# Patient Record
Sex: Female | Born: 1945 | Race: White | Hispanic: No | Marital: Married | Smoking: Never smoker
Health system: Southern US, Community
[De-identification: ages and names within clinical notes are randomized; demographics above are authoritative.]

## PROBLEM LIST (undated history)

## (undated) DIAGNOSIS — M8580 Other specified disorders of bone density and structure, unspecified site: Secondary | ICD-10-CM

## (undated) DIAGNOSIS — T7840XA Allergy, unspecified, initial encounter: Secondary | ICD-10-CM

## (undated) DIAGNOSIS — Q676 Pectus excavatum: Secondary | ICD-10-CM

## (undated) DIAGNOSIS — D1800 Hemangioma unspecified site: Secondary | ICD-10-CM

## (undated) DIAGNOSIS — E785 Hyperlipidemia, unspecified: Secondary | ICD-10-CM

## (undated) DIAGNOSIS — I493 Ventricular premature depolarization: Secondary | ICD-10-CM

## (undated) DIAGNOSIS — K219 Gastro-esophageal reflux disease without esophagitis: Secondary | ICD-10-CM

## (undated) DIAGNOSIS — I10 Essential (primary) hypertension: Secondary | ICD-10-CM

## (undated) DIAGNOSIS — I341 Nonrheumatic mitral (valve) prolapse: Secondary | ICD-10-CM

## (undated) HISTORY — PX: COLONOSCOPY: SHX174

## (undated) HISTORY — DX: Hemangioma unspecified site: D18.00

## (undated) HISTORY — DX: Pectus excavatum: Q67.6

## (undated) HISTORY — DX: Other specified disorders of bone density and structure, unspecified site: M85.80

## (undated) HISTORY — DX: Gastro-esophageal reflux disease without esophagitis: K21.9

## (undated) HISTORY — DX: Hyperlipidemia, unspecified: E78.5

## (undated) HISTORY — DX: Ventricular premature depolarization: I49.3

## (undated) HISTORY — DX: Allergy, unspecified, initial encounter: T78.40XA

---

## 1988-07-23 HISTORY — PX: BREAST EXCISIONAL BIOPSY: SUR124

## 1998-08-03 ENCOUNTER — Other Ambulatory Visit: Admission: RE | Admit: 1998-08-03 | Discharge: 1998-08-03 | Payer: Self-pay | Admitting: Obstetrics and Gynecology

## 1999-01-25 ENCOUNTER — Ambulatory Visit (HOSPITAL_COMMUNITY): Admission: RE | Admit: 1999-01-25 | Discharge: 1999-01-25 | Payer: Self-pay

## 1999-05-11 ENCOUNTER — Other Ambulatory Visit: Admission: RE | Admit: 1999-05-11 | Discharge: 1999-05-11 | Payer: Self-pay | Admitting: Obstetrics and Gynecology

## 1999-05-11 ENCOUNTER — Encounter (INDEPENDENT_AMBULATORY_CARE_PROVIDER_SITE_OTHER): Payer: Self-pay

## 1999-08-07 ENCOUNTER — Encounter: Admission: RE | Admit: 1999-08-07 | Discharge: 1999-08-07 | Payer: Self-pay | Admitting: Obstetrics and Gynecology

## 1999-08-07 ENCOUNTER — Encounter: Payer: Self-pay | Admitting: Obstetrics and Gynecology

## 1999-08-08 ENCOUNTER — Encounter (INDEPENDENT_AMBULATORY_CARE_PROVIDER_SITE_OTHER): Payer: Self-pay | Admitting: Specialist

## 1999-08-08 ENCOUNTER — Other Ambulatory Visit: Admission: RE | Admit: 1999-08-08 | Discharge: 1999-08-08 | Payer: Self-pay | Admitting: Obstetrics and Gynecology

## 2000-08-07 ENCOUNTER — Encounter: Payer: Self-pay | Admitting: Obstetrics and Gynecology

## 2000-08-07 ENCOUNTER — Encounter: Admission: RE | Admit: 2000-08-07 | Discharge: 2000-08-07 | Payer: Self-pay | Admitting: Obstetrics and Gynecology

## 2000-08-30 ENCOUNTER — Other Ambulatory Visit: Admission: RE | Admit: 2000-08-30 | Discharge: 2000-08-30 | Payer: Self-pay | Admitting: Obstetrics and Gynecology

## 2001-09-12 ENCOUNTER — Encounter: Admission: RE | Admit: 2001-09-12 | Discharge: 2001-09-12 | Payer: Self-pay | Admitting: Obstetrics and Gynecology

## 2001-09-12 ENCOUNTER — Encounter: Payer: Self-pay | Admitting: Obstetrics and Gynecology

## 2001-09-19 ENCOUNTER — Other Ambulatory Visit: Admission: RE | Admit: 2001-09-19 | Discharge: 2001-09-19 | Payer: Self-pay | Admitting: Obstetrics and Gynecology

## 2002-10-05 ENCOUNTER — Encounter: Payer: Self-pay | Admitting: Obstetrics and Gynecology

## 2002-10-05 ENCOUNTER — Encounter: Admission: RE | Admit: 2002-10-05 | Discharge: 2002-10-05 | Payer: Self-pay | Admitting: Obstetrics and Gynecology

## 2002-10-12 ENCOUNTER — Encounter: Admission: RE | Admit: 2002-10-12 | Discharge: 2002-10-12 | Payer: Self-pay | Admitting: Obstetrics and Gynecology

## 2002-10-12 ENCOUNTER — Encounter: Payer: Self-pay | Admitting: Obstetrics and Gynecology

## 2002-10-14 ENCOUNTER — Other Ambulatory Visit: Admission: RE | Admit: 2002-10-14 | Discharge: 2002-10-14 | Payer: Self-pay | Admitting: Obstetrics and Gynecology

## 2003-09-06 ENCOUNTER — Emergency Department (HOSPITAL_COMMUNITY): Admission: EM | Admit: 2003-09-06 | Discharge: 2003-09-06 | Payer: Self-pay | Admitting: Emergency Medicine

## 2003-11-23 ENCOUNTER — Encounter: Admission: RE | Admit: 2003-11-23 | Discharge: 2003-11-23 | Payer: Self-pay | Admitting: Obstetrics and Gynecology

## 2003-12-16 ENCOUNTER — Other Ambulatory Visit: Admission: RE | Admit: 2003-12-16 | Discharge: 2003-12-16 | Payer: Self-pay | Admitting: Obstetrics and Gynecology

## 2005-03-19 ENCOUNTER — Emergency Department (HOSPITAL_COMMUNITY): Admission: EM | Admit: 2005-03-19 | Discharge: 2005-03-20 | Payer: Self-pay | Admitting: Emergency Medicine

## 2005-05-16 ENCOUNTER — Encounter: Admission: RE | Admit: 2005-05-16 | Discharge: 2005-05-16 | Payer: Self-pay | Admitting: Obstetrics and Gynecology

## 2005-06-07 ENCOUNTER — Other Ambulatory Visit: Admission: RE | Admit: 2005-06-07 | Discharge: 2005-06-07 | Payer: Self-pay | Admitting: Obstetrics and Gynecology

## 2006-06-06 ENCOUNTER — Encounter: Admission: RE | Admit: 2006-06-06 | Discharge: 2006-06-06 | Payer: Self-pay | Admitting: Obstetrics and Gynecology

## 2006-06-17 ENCOUNTER — Encounter: Admission: RE | Admit: 2006-06-17 | Discharge: 2006-06-17 | Payer: Self-pay | Admitting: Obstetrics and Gynecology

## 2006-11-22 ENCOUNTER — Ambulatory Visit: Payer: Self-pay | Admitting: Internal Medicine

## 2006-12-05 ENCOUNTER — Ambulatory Visit: Payer: Self-pay | Admitting: Internal Medicine

## 2007-06-25 ENCOUNTER — Encounter: Admission: RE | Admit: 2007-06-25 | Discharge: 2007-06-25 | Payer: Self-pay | Admitting: Obstetrics and Gynecology

## 2008-06-25 ENCOUNTER — Encounter: Admission: RE | Admit: 2008-06-25 | Discharge: 2008-06-25 | Payer: Self-pay | Admitting: Obstetrics and Gynecology

## 2009-10-26 ENCOUNTER — Encounter: Admission: RE | Admit: 2009-10-26 | Discharge: 2009-10-26 | Payer: Self-pay | Admitting: Obstetrics and Gynecology

## 2009-11-03 ENCOUNTER — Encounter: Admission: RE | Admit: 2009-11-03 | Discharge: 2009-11-03 | Payer: Self-pay | Admitting: Obstetrics and Gynecology

## 2010-11-23 ENCOUNTER — Other Ambulatory Visit: Payer: Self-pay | Admitting: Obstetrics and Gynecology

## 2010-11-23 DIAGNOSIS — Z1231 Encounter for screening mammogram for malignant neoplasm of breast: Secondary | ICD-10-CM

## 2010-12-06 ENCOUNTER — Ambulatory Visit
Admission: RE | Admit: 2010-12-06 | Discharge: 2010-12-06 | Disposition: A | Discharge status: Home / Self Care | Payer: Federal, State, Local not specified - PPO | Source: Ambulatory Visit | Attending: Obstetrics and Gynecology | Admitting: Obstetrics and Gynecology

## 2010-12-06 DIAGNOSIS — Z1231 Encounter for screening mammogram for malignant neoplasm of breast: Secondary | ICD-10-CM

## 2011-10-02 ENCOUNTER — Ambulatory Visit
Admission: RE | Admit: 2011-10-02 | Discharge: 2011-10-02 | Disposition: A | Discharge status: Home / Self Care | Payer: Federal, State, Local not specified - PPO | Source: Ambulatory Visit | Attending: Allergy | Admitting: Allergy

## 2011-10-02 ENCOUNTER — Other Ambulatory Visit: Payer: Self-pay | Admitting: Allergy

## 2011-10-02 DIAGNOSIS — J329 Chronic sinusitis, unspecified: Secondary | ICD-10-CM

## 2011-11-28 ENCOUNTER — Other Ambulatory Visit: Payer: Self-pay | Admitting: Obstetrics and Gynecology

## 2011-11-28 DIAGNOSIS — N644 Mastodynia: Secondary | ICD-10-CM

## 2011-12-14 ENCOUNTER — Ambulatory Visit
Admission: RE | Admit: 2011-12-14 | Discharge: 2011-12-14 | Disposition: A | Discharge status: Home / Self Care | Payer: Federal, State, Local not specified - PPO | Source: Ambulatory Visit | Attending: Obstetrics and Gynecology | Admitting: Obstetrics and Gynecology

## 2011-12-14 DIAGNOSIS — N644 Mastodynia: Secondary | ICD-10-CM

## 2012-10-21 ENCOUNTER — Encounter (HOSPITAL_COMMUNITY): Payer: Self-pay | Admitting: *Deleted

## 2012-10-21 ENCOUNTER — Emergency Department (HOSPITAL_COMMUNITY)
Admission: EM | Admit: 2012-10-21 | Discharge: 2012-10-22 | Disposition: A | Discharge status: Home / Self Care | Payer: Federal, State, Local not specified - PPO | Attending: Emergency Medicine | Admitting: Emergency Medicine

## 2012-10-21 DIAGNOSIS — H539 Unspecified visual disturbance: Secondary | ICD-10-CM | POA: Insufficient documentation

## 2012-10-21 DIAGNOSIS — I10 Essential (primary) hypertension: Secondary | ICD-10-CM | POA: Insufficient documentation

## 2012-10-21 DIAGNOSIS — R42 Dizziness and giddiness: Secondary | ICD-10-CM | POA: Insufficient documentation

## 2012-10-21 DIAGNOSIS — R55 Syncope and collapse: Secondary | ICD-10-CM | POA: Insufficient documentation

## 2012-10-21 DIAGNOSIS — R011 Cardiac murmur, unspecified: Secondary | ICD-10-CM | POA: Insufficient documentation

## 2012-10-21 DIAGNOSIS — R112 Nausea with vomiting, unspecified: Secondary | ICD-10-CM | POA: Insufficient documentation

## 2012-10-21 DIAGNOSIS — Z8679 Personal history of other diseases of the circulatory system: Secondary | ICD-10-CM | POA: Insufficient documentation

## 2012-10-21 DIAGNOSIS — Z79899 Other long term (current) drug therapy: Secondary | ICD-10-CM | POA: Insufficient documentation

## 2012-10-21 HISTORY — DX: Nonrheumatic mitral (valve) prolapse: I34.1

## 2012-10-21 HISTORY — DX: Essential (primary) hypertension: I10

## 2012-10-21 LAB — POCT I-STAT, CHEM 8
Chloride: 109 mEq/L (ref 96–112)
Creatinine, Ser: 0.8 mg/dL (ref 0.50–1.10)
Glucose, Bld: 124 mg/dL — ABNORMAL HIGH (ref 70–99)
Hemoglobin: 15 g/dL (ref 12.0–15.0)
Potassium: 3.5 mEq/L (ref 3.5–5.1)

## 2012-10-21 LAB — CBC WITH DIFFERENTIAL/PLATELET
Basophils Absolute: 0 10*3/uL (ref 0.0–0.1)
Basophils Relative: 0 % (ref 0–1)
HCT: 43.7 % (ref 36.0–46.0)
Lymphocytes Relative: 27 % (ref 12–46)
MCHC: 33.4 g/dL (ref 30.0–36.0)
Monocytes Absolute: 0.3 10*3/uL (ref 0.1–1.0)
Neutro Abs: 3.6 10*3/uL (ref 1.7–7.7)
Neutrophils Relative %: 63 % (ref 43–77)
Platelets: 274 10*3/uL (ref 150–400)
RDW: 13.9 % (ref 11.5–15.5)
WBC: 5.6 10*3/uL (ref 4.0–10.5)

## 2012-10-21 MED ORDER — SODIUM CHLORIDE 0.9 % IV BOLUS (SEPSIS)
500.0000 mL | Freq: Once | INTRAVENOUS | Status: AC
  Administered 2012-10-21: 500 mL via INTRAVENOUS

## 2012-10-21 NOTE — ED Notes (Signed)
EMS called to [facility] event complex.  Found patient ambulatory with altered gait. Patient was able to maintain consciousness but felt she was going to pas out. EMS states that patient was clammy, pale and diaphoretic.  Patient states this  Is the third time this type of event has happened.  She has not been seen for this issue

## 2012-10-21 NOTE — ED Notes (Signed)
Bed:WA16<BR> Expected date:<BR> Expected time:<BR> Means of arrival:<BR> Comments:<BR> EMS

## 2012-10-21 NOTE — ED Provider Notes (Signed)
History     CSN: [id number]  Arrival date & time 10/21/12  2111   First MD Initiated Contact with Patient 10/21/12 2128      Chief Complaint  Patient presents with  . Near Syncope    (Consider location/radiation/quality/duration/timing/severity/associated sxs/prior treatment) HPI Comments: Patient presents from [place], [place], she was at a concert when she suddenly had a visual disturbance.  Felt nauseated.  Attempted to walk to the bathroom, could not make it to to the dizziness had to sit down.  Denies any shortness of breath, chest pain, recent illnesses.  This is his third such episode all at the [facility] during an event, the last 2, times.  She was able to make it to the restroom, vomiting.  Small amount sig for several minutes, and return.  She has not mentioned this to her primary care physician.  She does have a history of mitral valve prolapse for many years.  She was evaluated by cardiology at that time with no followup necessary.  She reports she is now back at her baseline  The history is provided by the patient.    Past Medical History  Diagnosis Date  . Hypertension   . Mitral valve prolapse     History reviewed. No pertinent past surgical history.  History reviewed. No pertinent family history.  History  Substance Use Topics  . Smoking status: Never Smoker   . Smokeless tobacco: Not on file  . Alcohol Use: Not on file     Comment: social    OB History   Grav Para Term Preterm Abortions TAB SAB Ect Mult Living                  Review of Systems  Constitutional: Negative for fever and chills.  Respiratory: Negative for shortness of breath.   Cardiovascular: Negative for chest pain and leg swelling.  Gastrointestinal: Positive for nausea.  Skin: Positive for pallor.  Neurological: Positive for dizziness and weakness.  All other systems reviewed and are negative.    Allergies  Review of patient's allergies indicates no known allergies.  Home  Medications   Current Outpatient Rx  Name  Route  Sig  Dispense  Refill  . amLODipine-benazepril (LOTREL) 5-10 MG per capsule   Oral   Take 1 capsule by mouth daily.         . Azelastine-Fluticasone (DYMISTA) 137-50 MCG/ACT SUSP   Nasal   Place 1 spray into the nose daily as needed (stuffy nose).         . Calcium-Vitamin D-Vitamin K (VIACTIV PO)   Oral   Take 1 tablet by mouth daily.         Marland Kitchen ibuprofen (ADVIL,MOTRIN) 200 MG tablet   Oral   Take 200 mg by mouth every 6 (six) hours as needed for pain.         . montelukast (SINGULAIR) 10 MG tablet   Oral   Take 10 mg by mouth at bedtime.         . Multiple Vitamin (MULTIVITAMIN WITH MINERALS) TABS   Oral   Take 1 tablet by mouth daily.           BP 135/55  Pulse 88  Temp(Src) 98.3 F (36.8 C) (Oral)  Resp 15  SpO2 96%  Physical Exam  Nursing note and vitals reviewed. Constitutional: She is oriented to person, place, and time. She appears well-developed and well-nourished.  HENT:  Head: Normocephalic.  Eyes: Pupils are equal, round, and reactive  to light.  Neck: Normal range of motion.  Cardiovascular: Normal rate, regular rhythm and intact distal pulses.  PMI is not displaced.   Murmur heard. Pulses:      Carotid pulses are 2+ on the right side, and 2+ on the left side. Pulmonary/Chest: Effort normal and breath sounds normal.  Abdominal: Soft. Bowel sounds are normal.  Musculoskeletal: Normal range of motion. She exhibits no edema and no tenderness.  Neurological: She is alert and oriented to person, place, and time.  Skin: Skin is warm and dry. She is not diaphoretic. No pallor.    ED Course  Procedures (including critical care time)  Labs Reviewed  URINALYSIS, ROUTINE W REFLEX MICROSCOPIC - Abnormal; Notable for the following:    Leukocytes, UA SMALL (*)    All other components within normal limits  POCT I-STAT, CHEM 8 - Abnormal; Notable for the following:    Glucose, Bld 124 (*)    All  other components within normal limits  CBC WITH DIFFERENTIAL  TROPONIN I  URINE MICROSCOPIC-ADD ON   No results found.   1. Near syncope     ED ECG REPORT   Date: 10/22/2012  EKG Time: 12:57 AM  Rate: 93  Rhythm: normal sinus rhythm,  L atrial abnormality  Axis: normal  Intervals:none  ST&T Change: normal  Narrative Interpretation: abnormal            MDM  Patient's labs, troponin, EKG, chest x-ray, and urine, are all reviewed.  She is no longer symptomatic.  Refer patient back to her primary care physician for followup exam in 1-2 days at which time she will discuss her syncopal or near syncopal episodes and request a cardiology followup         Arman Filter, NP 10/22/12 [phone]

## 2012-10-22 LAB — URINALYSIS, ROUTINE W REFLEX MICROSCOPIC
Nitrite: NEGATIVE
Specific Gravity, Urine: 1.013 (ref 1.005–1.030)
Urobilinogen, UA: 0.2 mg/dL (ref 0.0–1.0)
pH: 5.5 (ref 5.0–8.0)

## 2012-10-22 LAB — URINE MICROSCOPIC-ADD ON

## 2012-10-22 NOTE — ED Notes (Signed)
Patient is alert and oriented x3.  She was given DC instructions and follow up visit instructions.  Patient gave verbal understanding. She was DC ambulatory under her own power to home.  V/S stable.  He was not showing any signs of distress on DC 

## 2012-10-22 NOTE — ED Provider Notes (Signed)
Medical screening examination/treatment/procedure(s) were performed by non-physician practitioner and as supervising physician I was immediately available for consultation/collaboration.   Loren Racer, MD 10/22/12 [phone]

## 2013-02-24 ENCOUNTER — Other Ambulatory Visit: Payer: Self-pay

## 2013-02-24 DIAGNOSIS — Z1231 Encounter for screening mammogram for malignant neoplasm of breast: Secondary | ICD-10-CM

## 2013-02-25 ENCOUNTER — Other Ambulatory Visit: Payer: Self-pay

## 2013-03-12 ENCOUNTER — Ambulatory Visit
Admission: RE | Admit: 2013-03-12 | Discharge: 2013-03-12 | Disposition: A | Discharge status: Home / Self Care | Payer: Federal, State, Local not specified - PPO | Source: Ambulatory Visit

## 2013-03-12 DIAGNOSIS — Z1231 Encounter for screening mammogram for malignant neoplasm of breast: Secondary | ICD-10-CM

## 2013-05-28 ENCOUNTER — Other Ambulatory Visit: Payer: Self-pay

## 2014-05-07 ENCOUNTER — Other Ambulatory Visit: Payer: Self-pay

## 2014-10-18 ENCOUNTER — Other Ambulatory Visit: Payer: Self-pay | Admitting: *Deleted

## 2014-10-18 DIAGNOSIS — I10 Essential (primary) hypertension: Secondary | ICD-10-CM

## 2014-10-25 DIAGNOSIS — I10 Essential (primary) hypertension: Secondary | ICD-10-CM

## 2015-01-17 ENCOUNTER — Other Ambulatory Visit: Payer: Self-pay

## 2015-03-11 ENCOUNTER — Other Ambulatory Visit: Payer: Self-pay

## 2015-03-11 ENCOUNTER — Encounter: Payer: Self-pay | Admitting: Internal Medicine

## 2015-03-11 DIAGNOSIS — Z1231 Encounter for screening mammogram for malignant neoplasm of breast: Secondary | ICD-10-CM

## 2015-03-17 ENCOUNTER — Ambulatory Visit
Admission: RE | Admit: 2015-03-17 | Discharge: 2015-03-17 | Disposition: A | Discharge status: Home / Self Care | Payer: Federal, State, Local not specified - PPO | Source: Ambulatory Visit

## 2015-03-17 DIAGNOSIS — Z1231 Encounter for screening mammogram for malignant neoplasm of breast: Secondary | ICD-10-CM

## 2016-04-30 ENCOUNTER — Other Ambulatory Visit: Payer: Self-pay | Admitting: Family Medicine

## 2016-04-30 DIAGNOSIS — Z1231 Encounter for screening mammogram for malignant neoplasm of breast: Secondary | ICD-10-CM

## 2016-05-08 ENCOUNTER — Ambulatory Visit
Admission: RE | Admit: 2016-05-08 | Discharge: 2016-05-08 | Disposition: A | Discharge status: Home / Self Care | Payer: Federal, State, Local not specified - PPO | Source: Ambulatory Visit | Attending: Family Medicine | Admitting: Family Medicine

## 2016-05-08 DIAGNOSIS — Z1231 Encounter for screening mammogram for malignant neoplasm of breast: Secondary | ICD-10-CM

## 2016-07-15 ENCOUNTER — Emergency Department (HOSPITAL_COMMUNITY)
Admission: EM | Admit: 2016-07-15 | Discharge: 2016-07-15 | Disposition: A | Discharge status: Home / Self Care | Payer: Federal, State, Local not specified - PPO | Attending: Emergency Medicine | Admitting: Emergency Medicine

## 2016-07-15 ENCOUNTER — Encounter (HOSPITAL_COMMUNITY): Payer: Self-pay

## 2016-07-15 DIAGNOSIS — Y999 Unspecified external cause status: Secondary | ICD-10-CM | POA: Diagnosis not present

## 2016-07-15 DIAGNOSIS — X12XXXA Contact with other hot fluids, initial encounter: Secondary | ICD-10-CM | POA: Insufficient documentation

## 2016-07-15 DIAGNOSIS — T23292A Burn of second degree of multiple sites of left wrist and hand, initial encounter: Secondary | ICD-10-CM | POA: Insufficient documentation

## 2016-07-15 DIAGNOSIS — Y939 Activity, unspecified: Secondary | ICD-10-CM | POA: Insufficient documentation

## 2016-07-15 DIAGNOSIS — Y929 Unspecified place or not applicable: Secondary | ICD-10-CM | POA: Insufficient documentation

## 2016-07-15 DIAGNOSIS — I10 Essential (primary) hypertension: Secondary | ICD-10-CM | POA: Insufficient documentation

## 2016-07-15 DIAGNOSIS — T310 Burns involving less than 10% of body surface: Secondary | ICD-10-CM | POA: Diagnosis not present

## 2016-07-15 DIAGNOSIS — T23202A Burn of second degree of left hand, unspecified site, initial encounter: Secondary | ICD-10-CM | POA: Diagnosis present

## 2016-07-15 MED ORDER — OXYCODONE-ACETAMINOPHEN 5-325 MG PO TABS
1.0000 | ORAL_TABLET | Freq: Once | ORAL | Status: AC
  Administered 2016-07-15: 1 via ORAL
  Filled 2016-07-15: qty 1

## 2016-07-15 MED ORDER — BACITRACIN ZINC 500 UNIT/GM EX OINT: 1.0000 "application " | TOPICAL_OINTMENT | Freq: Two times a day (BID) | CUTANEOUS | 1 refills | Status: DC

## 2016-07-15 MED ORDER — TETANUS-DIPHTH-ACELL PERTUSSIS 5-2.5-18.5 LF-MCG/0.5 IM SUSP
0.5000 mL | Freq: Once | INTRAMUSCULAR | Status: AC
  Administered 2016-07-15: 0.5 mL via INTRAMUSCULAR
  Filled 2016-07-15: qty 0.5

## 2016-07-15 MED ORDER — BACITRACIN ZINC 500 UNIT/GM EX OINT
1.0000 "application " | TOPICAL_OINTMENT | Freq: Two times a day (BID) | CUTANEOUS | Status: DC
  Administered 2016-07-15: 1 via TOPICAL
  Filled 2016-07-15: qty 0.9

## 2016-07-15 MED ORDER — OXYCODONE-ACETAMINOPHEN 5-325 MG PO TABS: 1.0000 | ORAL_TABLET | Freq: Four times a day (QID) | ORAL | 0 refills | Status: DC | PRN

## 2016-07-15 NOTE — ED Notes (Addendum)
Rings removed and put in belongings.

## 2016-07-15 NOTE — ED Provider Notes (Signed)
[place] DEPT Provider Note   CSN: [id number] Arrival date & time: 07/15/16  1941     History   Chief Complaint Chief Complaint  Patient presents with  . Burn    HPI Katherine Stafford is a 70 y.o. female.  Katherine Stafford is a 70 y.o. Female who presents to the ED with a burn to her left hand. She is right hand dominant. Patient reports she was heating up some honey in the microwave when it began to spill over. She reached into the microwave when it exploded onto her hand. She complains of pain to the palm of her hand and some aspects of her fingers. Some of the honey also splashed on her face. She reports this is not causing her any pain or concern at this time. She reports her pain is to the palm of her hand. She denies any pain to the top of her hand or fingers. She placed her hand in ice water after the burn. She is unsure of her last Tdap. She denies fevers, numbness, weakness, or pain to the dorsum of her hand.    The history is provided by the patient. No language interpreter was used.  Burn     Past Medical History:  Diagnosis Date  . Hemangioma    2.7 liver lesion on chest ct  . Hypertension   . Mitral valve prolapse   . Pectus excavatum   . PVC (premature ventricular contraction)     There are no active problems to display for this patient.   History reviewed. No pertinent surgical history.  OB History    No data available       Home Medications    Prior to Admission medications   Medication Sig Start Date End Date Taking? Authorizing Provider  amLODipine-benazepril (LOTREL) 5-10 MG per capsule Take 1 capsule by mouth daily.    Historical Provider, MD  Azelastine-Fluticasone (DYMISTA) 137-50 MCG/ACT SUSP Place 1 spray into the nose daily as needed (stuffy nose).    Historical Provider, MD  bacitracin ointment Apply 1 application topically 2 (two) times daily. 07/15/16   Waynetta Pean, PA-C  Calcium-Vitamin D-Vitamin K (VIACTIV PO) Take 1 tablet by  mouth daily.    Historical Provider, MD  cetirizine (ZYRTEC) 10 MG tablet Take 10 mg by mouth daily.    Historical Provider, MD  ibuprofen (ADVIL,MOTRIN) 200 MG tablet Take 200 mg by mouth every 6 (six) hours as needed for pain.    Historical Provider, MD  levocetirizine (XYZAL) 5 MG tablet Take 5 mg by mouth every evening.    Historical Provider, MD  montelukast (SINGULAIR) 10 MG tablet Take 10 mg by mouth at bedtime.    Historical Provider, MD  Multiple Vitamin (MULTIVITAMIN WITH MINERALS) TABS Take 1 tablet by mouth daily.    Historical Provider, MD  oxyCODONE-acetaminophen (PERCOCET/ROXICET) 5-325 MG tablet Take 1 tablet by mouth every 6 (six) hours as needed for moderate pain or severe pain. 07/15/16   Waynetta Pean, PA-C    Family History Family History  Problem Relation Age of Onset  . Mitral valve prolapse Mother   . Lymphoma Father   . Rheum arthritis Daughter     Social History Social History  Substance Use Topics  . Smoking status: Never Smoker  . Smokeless tobacco: Never Used  . Alcohol use No     Comment: social     Allergies   Patient has no known allergies.   Review of Systems Review of Systems  Constitutional: Negative for fever.  Skin: Positive for color change and wound.  Neurological: Negative for weakness and numbness.     Physical Exam Updated Vital Signs BP 197/100 (BP Location: Right Arm)   Pulse 119   Temp 98.6 F (37 C) (Oral)   Resp 20   Ht 5\' 5"  (1.651 m)   Wt 68 kg   SpO2 98%   BMI 24.96 kg/m   Physical Exam  Constitutional: She appears well-developed and well-nourished. No distress.  HENT:  Head: Normocephalic and atraumatic.  Eyes: Right eye exhibits no discharge. Left eye exhibits no discharge.  Cardiovascular: Normal rate, regular rhythm and intact distal pulses.   Bilateral radial pulses are intact. Good capillary refill to her left distal fingertips.  Pulmonary/Chest: Effort normal. No respiratory distress.  Neurological:  She is alert. Coordination normal.  Skin: Skin is warm and dry. Capillary refill takes less than 2 seconds. No rash noted. She is not diaphoretic. There is erythema. No pallor.  A second degree burns noted to the palmar aspect of her left hand. Several blisters noted to the palm. Sensation is intact. Rings removed from her hand. No burns noted to the dorsal aspect of her hand. No circumferential burns.   Psychiatric: She has a normal mood and affect. Her behavior is normal.  Nursing note and vitals reviewed.    ED Treatments / Results  Labs (all labs ordered are listed, but only abnormal results are displayed) Labs Reviewed - No data to display  EKG  EKG Interpretation None       Radiology No results found.  Procedures Procedures (including critical care time)  Medications Ordered in ED Medications  bacitracin ointment 1 application (1 application Topical Given 07/15/16 2114)  oxyCODONE-acetaminophen (PERCOCET/ROXICET) 5-325 MG per tablet 1 tablet (1 tablet Oral Given 07/15/16 2105)  Tdap (BOOSTRIX) injection 0.5 mL (0.5 mLs Intramuscular Given 07/15/16 2111)     Initial Impression / Assessment and Plan / ED Course  I have reviewed the triage vital signs and the nursing notes.  Pertinent labs & imaging results that were available during my care of the patient were reviewed by me and considered in my medical decision making (see chart for details).  Clinical Course    This is a 70 y.o. Female who presents to the ED with a burn to her left hand. She is right hand dominant. Patient reports she was heating up some honey in the microwave when it began to spill over. She reached into the microwave when it exploded onto her hand. She complains of pain to the palm of her hand and some aspects of her fingers. Some of the honey also splashed on her face. She reports this is not causing her any pain or concern at this time. She reports her pain is to the palm of her hand. She denies any  pain to the top of her hand or fingers. On exam the patient is afebrile nontoxic appearing. She has second-degree burns noted to the palmar aspect of her left hand. No circumferential burns. No burns noted to the dorsal aspect of her left hand. She is neurovascularly intact. Good capillary refill. Will dress with nonadherent dressing and bacitracin. We'll have her follow-up at the [facility]. Discussed wound care instructions with dressing changes and bacitracin. I encouraged her to watch for signs of infection and discussed signs and symptoms that would warrant return to the emergency department. I advised the patient to follow-up with their primary care provider  this week. I advised the patient to return to the emergency department with new or worsening symptoms or new concerns. The patient verbalized understanding and agreement with plan.    This patient was discussed with and evaluated by Dr. Regenia Skeeter who agrees with assessment and plan.   Final Clinical Impressions(s) / ED Diagnoses   Final diagnoses:  Partial thickness burn of multiple sites of left hand, initial encounter    New Prescriptions New Prescriptions   BACITRACIN OINTMENT    Apply 1 application topically 2 (two) times daily.   OXYCODONE-ACETAMINOPHEN (PERCOCET/ROXICET) 5-325 MG TABLET    Take 1 tablet by mouth every 6 (six) hours as needed for moderate pain or severe pain.     Waynetta Pean, PA-C 07/15/16 2135    Sherwood Gambler, MD 07/16/16 [phone]

## 2016-07-15 NOTE — ED Triage Notes (Signed)
PT C/O BURN BLISTERS TO THE FACE, LEFT AND RIGHT HANDS FROM HONEY. PT STS SHE WAS HEATING HONEY IN THE MICROWAVE, WHEN IT BEGAN TO SPILL OVER. SHE WENT TO TAKE IT OUT, AND IT EXPLODED, SPLASHING HER IN THE FACE AND THE LEFT AND RIGHT HANDS, CAUSING BLISTERS.

## 2016-11-09 ENCOUNTER — Encounter (HOSPITAL_COMMUNITY): Payer: Self-pay

## 2016-11-09 ENCOUNTER — Emergency Department (HOSPITAL_COMMUNITY)
Admission: EM | Admit: 2016-11-09 | Discharge: 2016-11-10 | Disposition: A | Discharge status: Home / Self Care | Payer: Federal, State, Local not specified - PPO | Attending: Emergency Medicine | Admitting: Emergency Medicine

## 2016-11-09 DIAGNOSIS — K529 Noninfective gastroenteritis and colitis, unspecified: Secondary | ICD-10-CM | POA: Insufficient documentation

## 2016-11-09 DIAGNOSIS — R1030 Lower abdominal pain, unspecified: Secondary | ICD-10-CM | POA: Diagnosis present

## 2016-11-09 DIAGNOSIS — I10 Essential (primary) hypertension: Secondary | ICD-10-CM | POA: Insufficient documentation

## 2016-11-09 LAB — COMPREHENSIVE METABOLIC PANEL
ALT: 27 U/L (ref 14–54)
AST: 33 U/L (ref 15–41)
Albumin: 4.5 g/dL (ref 3.5–5.0)
Alkaline Phosphatase: 71 U/L (ref 38–126)
Anion gap: 11 (ref 5–15)
BUN: 15 mg/dL (ref 6–20)
CHLORIDE: 104 mmol/L (ref 101–111)
CO2: 24 mmol/L (ref 22–32)
CREATININE: 0.98 mg/dL (ref 0.44–1.00)
Calcium: 10 mg/dL (ref 8.9–10.3)
GFR calc Af Amer: >60 mL/min (ref >60)
GFR, EST NON AFRICAN AMERICAN: 57 mL/min — AB (ref >60)
Glucose, Bld: 164 mg/dL — ABNORMAL HIGH (ref 65–99)
POTASSIUM: 4.2 mmol/L (ref 3.5–5.1)
Sodium: 139 mmol/L (ref 135–145)
Total Bilirubin: 0.9 mg/dL (ref 0.3–1.2)
Total Protein: 7.8 g/dL (ref 6.5–8.1)

## 2016-11-09 LAB — URINALYSIS, ROUTINE W REFLEX MICROSCOPIC
Bilirubin Urine: NEGATIVE
GLUCOSE, UA: NEGATIVE mg/dL
Ketones, ur: NEGATIVE mg/dL
NITRITE: NEGATIVE
PH: 5 (ref 5.0–8.0)
Protein, ur: NEGATIVE mg/dL
Specific Gravity, Urine: 1.017 (ref 1.005–1.030)

## 2016-11-09 LAB — CBC
HCT: 44.1 % (ref 36.0–46.0)
Hemoglobin: 14.8 g/dL (ref 12.0–15.0)
MCH: 31 pg (ref 26.0–34.0)
MCHC: 33.6 g/dL (ref 30.0–36.0)
MCV: 92.5 fL (ref 78.0–100.0)
Platelets: 323 10*3/uL (ref 150–400)
RBC: 4.77 MIL/uL (ref 3.87–5.11)
RDW: 13.5 % (ref 11.5–15.5)
WBC: 13.7 10*3/uL — AB (ref 4.0–10.5)

## 2016-11-09 LAB — LIPASE, BLOOD: LIPASE: 22 U/L (ref 11–51)

## 2016-11-09 NOTE — ED Provider Notes (Signed)
[place] DEPT Provider Note   CSN: [id number] Arrival date & time: 11/09/16  1831 By signing my name below, I, Dyke Brackett, attest that this documentation has been prepared under the direction and in the presence of non-physician practitioner, Nehemiah Settle A Man Effertz PA-C. Electronically Signed: Dyke Brackett, Scribe. 11/09/2016. 11:48 PM.   History   Chief Complaint Chief Complaint  Patient presents with  . Abdominal Pain    HPI Katherine Stafford is a 71 y.o. female with a history of HTN who presents to the Emergency Department complaining of gradually improving, waxing and waning, suprapubic abdominal pain onset today. She describes this as cramping, severe abdominal pain that lasts for about 1 minute at a time. She notes associated diarrhea and bloody discharge, nausea, vomiting 1x, and chills. No alleviating or modifying factors noted. Per pt, she was in her normal health yesterday. She had a normal colonoscopy 10 years ago.  Pt denies any fever or difficulty urinating and has no other complaints at this time.   The history is provided by the patient. No language interpreter was used.   Past Medical History:  Diagnosis Date  . Hemangioma    2.7 liver lesion on chest ct  . Hypertension   . Mitral valve prolapse   . Pectus excavatum   . PVC (premature ventricular contraction)     There are no active problems to display for this patient.   History reviewed. No pertinent surgical history.  OB History    No data available       Home Medications    Prior to Admission medications   Medication Sig Start Date End Date Taking? Authorizing Provider  amLODipine-benazepril (LOTREL) 5-20 MG capsule Take 1 capsule by mouth 2 (two) times daily. 10/05/16  Yes Historical Provider, MD  Calcium-Vitamin D-Vitamin K (VIACTIV PO) Take 1 tablet by mouth daily.   Yes Historical Provider, MD  diphenhydramine-acetaminophen (TYLENOL PM) 25-500 MG TABS tablet Take 1-2 tablets by mouth at bedtime as  needed (pain,sleep).   Yes Historical Provider, MD  levocetirizine (XYZAL) 5 MG tablet Take 5 mg by mouth every evening.   Yes Historical Provider, MD  montelukast (SINGULAIR) 10 MG tablet Take 10 mg by mouth every morning.    Yes Historical Provider, MD  Multiple Vitamin (MULTIVITAMIN WITH MINERALS) TABS Take 1 tablet by mouth daily.   Yes Historical Provider, MD  bacitracin ointment Apply 1 application topically 2 (two) times daily. Patient not taking: Reported on 11/09/2016 07/15/16   Waynetta Pean, PA-C  oxyCODONE-acetaminophen (PERCOCET/ROXICET) 5-325 MG tablet Take 1 tablet by mouth every 6 (six) hours as needed for moderate pain or severe pain. Patient not taking: Reported on 11/09/2016 07/15/16   Waynetta Pean, PA-C    Family History Family History  Problem Relation Age of Onset  . Mitral valve prolapse Mother   . Lymphoma Father   . Rheum arthritis Daughter     Social History Social History  Substance Use Topics  . Smoking status: Never Smoker  . Smokeless tobacco: Never Used  . Alcohol use No     Comment: social     Allergies   Patient has no known allergies.   Review of Systems Review of Systems  Constitutional: Positive for chills.  Gastrointestinal: Positive for blood in stool and diarrhea.  Genitourinary: Negative for difficulty urinating.  Musculoskeletal: Negative for myalgias.  Allergic/Immunologic: Negative for immunocompromised state.  Neurological: Negative for weakness.  Psychiatric/Behavioral: Negative for confusion.     Physical Exam Updated Vital Signs BP Marland Kitchen)  153/74 (BP Location: Left Arm)   Pulse 86   Temp 99.1 F (37.3 C) (Oral)   Resp 16   Ht 5\' 5"  (1.651 m)   Wt 144 lb (65.3 kg)   SpO2 97%   BMI 23.96 kg/m   Physical Exam  Constitutional: She is oriented to person, place, and time. She appears well-developed and well-nourished. No distress.  HENT:  Head: Normocephalic and atraumatic.  Eyes: Conjunctivae are normal.    Cardiovascular: Normal rate.   Pulmonary/Chest: Effort normal.  Abdominal: Soft. She exhibits no distension. There is tenderness.  Infraumbilical and LLQ abdominal tenderness No distention.   Neurological: She is alert and oriented to person, place, and time.  Skin: Skin is warm and dry.  Psychiatric: She has a normal mood and affect.  Nursing note and vitals reviewed.  ED Treatments / Results  DIAGNOSTIC STUDIES:  Oxygen Saturation is 98% on RA, normal by my interpretation.    COORDINATION OF CARE:  11:57 PM Will order CT abdomen. Discussed treatment plan with pt at bedside and pt agreed to plan.   Labs (all labs ordered are listed, but only abnormal results are displayed) Labs Reviewed  COMPREHENSIVE METABOLIC PANEL - Abnormal; Notable for the following:       Result Value   Glucose, Bld 164 (*)    GFR calc non Af Amer 57 (*)    All other components within normal limits  CBC - Abnormal; Notable for the following:    WBC 13.7 (*)    All other components within normal limits  URINALYSIS, ROUTINE W REFLEX MICROSCOPIC - Abnormal; Notable for the following:    Hgb urine dipstick SMALL (*)    Leukocytes, UA TRACE (*)    Bacteria, UA RARE (*)    Squamous Epithelial / LPF 0-5 (*)    All other components within normal limits  LIPASE, BLOOD    EKG  EKG Interpretation None       Radiology No results found.  Procedures Procedures (including critical care time)  Medications Ordered in ED Medications - No data to display   Initial Impression / Assessment and Plan / ED Course  I have reviewed the triage vital signs and the nursing notes.  Pertinent labs & imaging results that were available during my care of the patient were reviewed by me and considered in my medical decision making (see chart for details).   Patient with stable VS presents with lower abdominal pain, diarrhea with bloody stool, low grade fever. She appears in NAD.   Labs with mild leukocytosis. CT  scan clinically corollate with dx of diverticulitis. She is felt stable for treatment as outpatient and discharged home with Cipro and flagyl. Strict return precautions discussed. Dr. Randal Buba has seen the patient and agrees with plan of care.   Final Clinical Impressions(s) / ED Diagnoses   Final diagnoses:  None   1. Diverticulitis  New Prescriptions New Prescriptions   No medications on file   I personally performed the services described in this documentation, which was scribed in my presence. The recorded information has been reviewed and is accurate.      Charlann Lange, PA-C 11/19/16 1228    April Palumbo, MD 11/19/16 [phone]

## 2016-11-09 NOTE — ED Triage Notes (Addendum)
Pt from MD office.  Pt with abdominal pain and diarrhea since yesterday.  Diarrhea with bloody discharge.  No fever.  No change in urination.  Nausea with vomiting x 1.

## 2016-11-09 NOTE — ED Notes (Signed)
Per Dr. Darl Householder, repeat labs.

## 2016-11-10 ENCOUNTER — Emergency Department (HOSPITAL_COMMUNITY): Payer: Federal, State, Local not specified - PPO

## 2016-11-10 MED ORDER — CIPROFLOXACIN HCL 500 MG PO TABS: 500.0000 mg | ORAL_TABLET | Freq: Two times a day (BID) | ORAL | 0 refills | Status: DC

## 2016-11-10 MED ORDER — METRONIDAZOLE 500 MG PO TABS: 500.0000 mg | ORAL_TABLET | Freq: Three times a day (TID) | ORAL | 0 refills | Status: DC

## 2016-11-10 MED ORDER — HYDROCODONE-ACETAMINOPHEN 5-325 MG PO TABS: 1.0000 | ORAL_TABLET | ORAL | 0 refills | Status: DC | PRN

## 2016-11-10 MED ORDER — CIPROFLOXACIN HCL 500 MG PO TABS
500.0000 mg | ORAL_TABLET | Freq: Once | ORAL | Status: AC
  Administered 2016-11-10: 500 mg via ORAL
  Filled 2016-11-10: qty 1

## 2016-11-10 MED ORDER — ONDANSETRON 4 MG PO TBDP: 4.0000 mg | ORAL_TABLET | Freq: Three times a day (TID) | ORAL | 0 refills | Status: DC | PRN

## 2016-11-10 MED ORDER — METRONIDAZOLE 500 MG PO TABS: 500.0000 mg | ORAL_TABLET | Freq: Two times a day (BID) | ORAL | 0 refills | Status: DC

## 2016-11-10 MED ORDER — METRONIDAZOLE 500 MG PO TABS
500.0000 mg | ORAL_TABLET | Freq: Once | ORAL | Status: AC
  Administered 2016-11-10: 500 mg via ORAL
  Filled 2016-11-10: qty 1

## 2016-11-10 MED ORDER — IOPAMIDOL (ISOVUE-300) INJECTION 61%
INTRAVENOUS | Status: AC
  Administered 2016-11-10: 01:00:00
  Filled 2016-11-10: qty 100

## 2016-11-10 MED ORDER — IOPAMIDOL (ISOVUE-300) INJECTION 61%
100.0000 mL | Freq: Once | INTRAVENOUS | Status: AC | PRN
  Administered 2016-11-10: 100 mL via INTRAVENOUS

## 2016-11-10 NOTE — Discharge Instructions (Signed)
Take medications as prescribed. Follow up with your doctor for recheck in 2 days. Return to the emergency department if you have any high fever, uncontrolled pain or vomiting or for new concern.

## 2016-11-13 ENCOUNTER — Encounter: Payer: Self-pay | Admitting: Internal Medicine

## 2016-12-19 ENCOUNTER — Ambulatory Visit (INDEPENDENT_AMBULATORY_CARE_PROVIDER_SITE_OTHER): Payer: Federal, State, Local not specified - PPO | Admitting: Internal Medicine

## 2016-12-19 ENCOUNTER — Encounter: Payer: Self-pay | Admitting: Internal Medicine

## 2016-12-19 VITALS — BP 140/78 | HR 108 | Ht 64.75 in | Wt 143.5 lb

## 2016-12-19 DIAGNOSIS — K219 Gastro-esophageal reflux disease without esophagitis: Secondary | ICD-10-CM | POA: Diagnosis not present

## 2016-12-19 DIAGNOSIS — K625 Hemorrhage of anus and rectum: Secondary | ICD-10-CM

## 2016-12-19 DIAGNOSIS — K559 Vascular disorder of intestine, unspecified: Secondary | ICD-10-CM | POA: Diagnosis not present

## 2016-12-19 DIAGNOSIS — R935 Abnormal findings on diagnostic imaging of other abdominal regions, including retroperitoneum: Secondary | ICD-10-CM | POA: Diagnosis not present

## 2016-12-19 DIAGNOSIS — Z1211 Encounter for screening for malignant neoplasm of colon: Secondary | ICD-10-CM | POA: Diagnosis not present

## 2016-12-19 MED ORDER — NA SULFATE-K SULFATE-MG SULF 17.5-3.13-1.6 GM/177ML PO SOLN: 1.0000 | Freq: Once | ORAL | 0 refills | Status: AC

## 2016-12-19 NOTE — Patient Instructions (Signed)

## 2016-12-19 NOTE — Progress Notes (Signed)
HISTORY OF PRESENT ILLNESS:  Katherine Stafford is a 71 y.o. female with hypertension, hyperlipidemia, anxiety/depression, and borderline personality disorder who is referred today by her primary care provider Dr. Stephanie Stafford with chief complaints of abdominal pain with rectal bleeding requiring emergency room evaluation, the need for follow-up colonoscopy, and problems with chronic acid reflux. Outside office records, emergency room record, laboratories, CT scan, prior endoscopy reports reviewed. The patient was last seen here in May 2008 when she was sent by her gynecologist for routine screening colonoscopy. The examination was normal. Follow-up in 10 years recommended. She was seen in the emergency department 11/09/2016 for cramping abdominal pain and bloody diarrhea. She reports to me low-grade fever. Blood work revealed leukocytosis with white blood cell count of 13.7. Contrast-enhanced CT scan of the abdomen and pelvis revealed left-sided segmental colitis. She was empirically treated with ciprofloxacin and metronidazole. She follow-up with her PCP 3 days later. Was still a little tender. Follow-up white blood cell count was 12.0. Hemoglobin 13.7. Impression was colitis and GI referral for "follow-up of colitis and bright red blood per rectum" made. Patient tells me that shortly thereafter problems with pain resolved. No recurrence. No bleeding. In terms of reflux, she states she has had this for at least 10 years. No dysphagia. Substernal burning discomfort intermittently. Tells me that she saw her PCP in September for chest pain which was deemed secondary to reflux. Often when lying at night. She takes famotidine on demand. She wonders if there has been any esophageal damage or should she take something different to address symptoms. Difficult to pin her down, but she seems to have symptoms several times per month. No weight loss. GI review of systems is otherwise negative. No family history of colon cancer or  esophageal cancer.  REVIEW OF SYSTEMS:  All non-GI ROS unless otherwise stated in the history of present illness negative except for seasonal allergies  Past Medical History:  Diagnosis Date  . Anxiety   . Borderline personality disorder   . Depression   . Hemangioma    2.7 liver lesion on chest ct  . Hyperlipidemia   . Hypertension   . Mitral valve prolapse   . Pectus excavatum   . PVC (premature ventricular contraction)     Past Surgical History:  Procedure Laterality Date  . NO PAST SURGERIES      Social History Katherine Stafford  reports that she has never smoked. She has never used smokeless tobacco. She reports that she drinks alcohol. She reports that she does not use drugs.  family history includes Lymphoma in her father; Mitral valve prolapse in her mother; Rheum arthritis in her daughter; Thyroid cancer in her daughter.  No Known Allergies     PHYSICAL EXAMINATION: Vital signs: BP 140/78 (BP Location: Left Arm, Patient Position: Sitting, Cuff Size: Normal)   Pulse (!) 108   Ht 5' 4.75" (1.645 m) Comment: height measured without shoes  Wt 143 lb 8 oz (65.1 kg)   BMI 24.06 kg/m   Constitutional: generally well-appearing, no acute distress Psychiatric: Anxious, alert and oriented x3, somewhat elusive with answers but cooperative Eyes: extraocular movements intact, anicteric, conjunctiva pink Mouth: oral pharynx moist, no lesions Neck: supple without thyromegaly Lymph: no lymphadenopathy Cardiovascular: heart regular rate and rhythm, no murmur Lungs: clear to auscultation bilaterally Abdomen: soft, nontender, nondistended, no obvious ascites, no peritoneal signs, normal bowel sounds, no organomegaly Rectal: Deferred until colonoscopy Extremities: no clubbing cyanosis or lower extremity edema bilaterally Skin: no lesions  on visible extremities Neuro: No focal deficits. Cranial nerves intact. No asterixis.    ASSESSMENT:  #1. Acute colitis as manifested by  abdominal pain, rectal bleeding, fever, and leukocytosis. Clinical, laboratory and radiographic presentation most consistent with ischemic colitis. Infectious colitis possible. Resolved #2. Abnormal CT scan. Segmental colitis #3. Chronic GERD without alarm features. #4. Last screening colonoscopy May 2008. Normal   PLAN:  #1. Reflux precautions #2. Schedule upper endoscopy to rule out Barrett's esophagus or other causes for chronic dyspeptic symptoms other than GERD.The nature of the procedure, as well as the risks, benefits, and alternatives were carefully and thoroughly reviewed with the patient. Ample time for discussion and questions allowed. The patient understood, was satisfied, and agreed to proceed. #3. Schedule colonoscopy satisfied follow-up screening requirements and evaluate recent problems with rectal bleeding.The nature of the procedure, as well as the risks, benefits, and alternatives were carefully and thoroughly reviewed with the patient. Ample time for discussion and questions allowed. The patient understood, was satisfied, and agreed to proceed. #4. Additional recommendations, if any, to follow results of her endoscopic procedures  A copy of this consultation note has been sent to Dr. Stephanie Stafford

## 2017-01-02 ENCOUNTER — Other Ambulatory Visit: Payer: Self-pay | Admitting: Family Medicine

## 2017-01-02 DIAGNOSIS — R42 Dizziness and giddiness: Secondary | ICD-10-CM

## 2017-01-15 ENCOUNTER — Ambulatory Visit
Admission: RE | Admit: 2017-01-15 | Discharge: 2017-01-15 | Disposition: A | Discharge status: Home / Self Care | Payer: Federal, State, Local not specified - PPO | Source: Ambulatory Visit | Attending: Family Medicine | Admitting: Family Medicine

## 2017-01-15 DIAGNOSIS — R42 Dizziness and giddiness: Secondary | ICD-10-CM

## 2017-01-15 MED ORDER — GADOBENATE DIMEGLUMINE 529 MG/ML IV SOLN
13.0000 mL | Freq: Once | INTRAVENOUS | Status: AC | PRN
  Administered 2017-01-15: 13 mL via INTRAVENOUS

## 2017-01-17 ENCOUNTER — Encounter: Payer: Self-pay | Admitting: Internal Medicine

## 2017-02-04 ENCOUNTER — Encounter: Payer: Self-pay | Admitting: Internal Medicine

## 2017-02-04 ENCOUNTER — Ambulatory Visit (AMBULATORY_SURGERY_CENTER): Payer: Federal, State, Local not specified - PPO | Admitting: Internal Medicine

## 2017-02-04 VITALS — BP 132/92 | HR 84 | Temp 98.7°F | Resp 19 | Ht 64.75 in | Wt 143.0 lb

## 2017-02-04 DIAGNOSIS — K209 Esophagitis, unspecified without bleeding: Secondary | ICD-10-CM

## 2017-02-04 DIAGNOSIS — Z1211 Encounter for screening for malignant neoplasm of colon: Secondary | ICD-10-CM

## 2017-02-04 DIAGNOSIS — Z1212 Encounter for screening for malignant neoplasm of rectum: Secondary | ICD-10-CM | POA: Diagnosis not present

## 2017-02-04 DIAGNOSIS — K559 Vascular disorder of intestine, unspecified: Secondary | ICD-10-CM

## 2017-02-04 DIAGNOSIS — K222 Esophageal obstruction: Secondary | ICD-10-CM | POA: Diagnosis not present

## 2017-02-04 DIAGNOSIS — D124 Benign neoplasm of descending colon: Secondary | ICD-10-CM

## 2017-02-04 DIAGNOSIS — K219 Gastro-esophageal reflux disease without esophagitis: Secondary | ICD-10-CM | POA: Diagnosis not present

## 2017-02-04 MED ORDER — OMEPRAZOLE 40 MG PO CPDR: 40.0000 mg | DELAYED_RELEASE_CAPSULE | Freq: Every day | ORAL | 3 refills | Status: DC

## 2017-02-04 MED ORDER — SODIUM CHLORIDE 0.9 % IV SOLN: 500.0000 mL | INTRAVENOUS | Status: AC

## 2017-02-04 NOTE — Progress Notes (Signed)
No egg or soy allergy   1st, 2nd unsucc to rt Ac and Rt hand by J scott RN- pt tolerated well

## 2017-02-04 NOTE — Progress Notes (Signed)
Called to room to assist during endoscopic procedure.  Patient ID and intended procedure confirmed with present staff. Received instructions for my participation in the procedure from the performing physician.  

## 2017-02-04 NOTE — Op Note (Signed)
[facility] Patient Name: Katherine Stafford Procedure Date: 02/04/2017 3:24 PM MRN: [id number] Endoscopist: Docia Chuck. Henrene Pastor , MD Age: 71 Referring MD:  Date of Birth: January 19, 1946 Gender: Female Account #: [id number] Procedure:                Colonoscopy, with cold snare polypectomy x 1 Indications:              Screening for colorectal malignant neoplasm.                            Negative index exam 10 years ago Medicines:                Monitored Anesthesia Care Procedure:                Pre-Anesthesia Assessment:                           - Prior to the procedure, a History and Physical                            was performed, and patient medications and                            allergies were reviewed. The patient's tolerance of                            previous anesthesia was also reviewed. The risks                            and benefits of the procedure and the sedation                            options and risks were discussed with the patient.                            All questions were answered, and informed consent                            was obtained. Prior Anticoagulants: The patient has                            taken no previous anticoagulant or antiplatelet                            agents. ASA Grade Assessment: II - A patient with                            mild systemic disease. After reviewing the risks                            and benefits, the patient was deemed in                            satisfactory condition to undergo the procedure.  After obtaining informed consent, the colonoscope                            was passed under direct vision. Throughout the                            procedure, the patient's blood pressure, pulse, and                            oxygen saturations were monitored continuously. The                            Colonoscope was introduced through the anus and   advanced to the the cecum, identified by                            appendiceal orifice and ileocecal valve. The                            ileocecal valve, appendiceal orifice, and rectum                            were photographed. The quality of the bowel                            preparation was excellent. The colonoscopy was                            performed without difficulty. The patient tolerated                            the procedure well. The bowel preparation used was                            SUPREP. Scope In: 3:37:14 PM Scope Out: 3:51:19 PM Scope Withdrawal Time: 0 hours 11 minutes 3 seconds  Total Procedure Duration: 0 hours 14 minutes 5 seconds  Findings:                 A 5 mm polyp was found in the descending colon. The                            polyp was sessile.                           Internal hemorrhoids were found during                            retroflexion. The hemorrhoids were small.                           The exam was otherwise without abnormality on                            direct and retroflexion views. Complications:  No immediate complications. Estimated blood loss:                            None. Estimated Blood Loss:     Estimated blood loss: none. Impression:               - One 5 mm polyp in the descending colon.                           - Internal hemorrhoids.                           - The examination was otherwise normal on direct                            and retroflexion views.                           - No specimens collected. Recommendation:           - Repeat colonoscopy in 5-10 years for surveillance.                           - Patient has a contact number available for                            emergencies. The signs and symptoms of potential                            delayed complications were discussed with the                            patient. Return to normal activities tomorrow.                             Written discharge instructions were provided to the                            patient.                           - Resume previous diet.                           - Continue present medications.                           - Await pathology results. Docia Chuck. Henrene Pastor, MD 02/04/2017 3:58:36 PM This report has been signed electronically.

## 2017-02-04 NOTE — Progress Notes (Signed)
A/ox3, pleased with MAC, report to RN 

## 2017-02-04 NOTE — Patient Instructions (Signed)
Impression/Recommendations:  GERD handout given to patient. Polyp handout given to patient. Hemorrhoid handout given to patient.  Reflux precautions.  Begin Omeprazole 40 mg. Daily.  Follow-up in office appointment with Dr. Henrene Pastor in 6-8 weeks.  Repeat colonoscopy in 5-10 years for surveillance.  Resume previous diet. Continue present medications.  YOU HAD AN ENDOSCOPIC PROCEDURE TODAY AT [facility]:   Refer to the procedure report that was given to you for any specific questions about what was found during the examination.  If the procedure report does not answer your questions, please call your gastroenterologist to clarify.  If you requested that your care partner not be given the details of your procedure findings, then the procedure report has been included in a sealed envelope for you to review at your convenience later.  YOU SHOULD EXPECT: Some feelings of bloating in the abdomen. Passage of more gas than usual.  Walking can help get rid of the air that was put into your GI tract during the procedure and reduce the bloating. If you had a lower endoscopy (such as a colonoscopy or flexible sigmoidoscopy) you may notice spotting of blood in your stool or on the toilet paper. If you underwent a bowel prep for your procedure, you may not have a normal bowel movement for a few days.  Please Note:  You might notice some irritation and congestion in your nose or some drainage.  This is from the oxygen used during your procedure.  There is no need for concern and it should clear up in a day or so.  SYMPTOMS TO REPORT IMMEDIATELY:   Following lower endoscopy (colonoscopy or flexible sigmoidoscopy):  Excessive amounts of blood in the stool  Significant tenderness or worsening of abdominal pains  Swelling of the abdomen that is new, acute  Fever of 100F or higher   Following upper endoscopy (EGD)  Vomiting of blood or coffee ground material  New chest pain or pain  under the shoulder blades  Painful or persistently difficult swallowing  New shortness of breath  Fever of 100F or higher  Black, tarry-looking stools  For urgent or emergent issues, a gastroenterologist can be reached at any hour by calling [phone].   DIET:  We do recommend a small meal at first, but then you may proceed to your regular diet.  Drink plenty of fluids but you should avoid alcoholic beverages for 24 hours.  ACTIVITY:  You should plan to take it easy for the rest of today and you should NOT DRIVE or use heavy machinery until tomorrow (because of the sedation medicines used during the test).    FOLLOW UP: Our staff will call the number listed on your records the next business day following your procedure to check on you and address any questions or concerns that you may have regarding the information given to you following your procedure. If we do not reach you, we will leave a message.  However, if you are feeling well and you are not experiencing any problems, there is no need to return our call.  We will assume that you have returned to your regular daily activities without incident.  If any biopsies were taken you will be contacted by phone or by letter within the next 1-3 weeks.  Please call us at [phone] if you have not heard about the biopsies in 3 weeks.    SIGNATURES/CONFIDENTIALITY: You and/or your care partner have signed paperwork which will be entered into your electronic  medical record.  These signatures attest to the fact that that the information above on your After Visit Summary has been reviewed and is understood.  Full responsibility of the confidentiality of this discharge information lies with you and/or your care-partner.

## 2017-02-04 NOTE — Op Note (Signed)
[facility] Patient Name: Katherine Stafford Procedure Date: 02/04/2017 3:24 PM MRN: [id number] Endoscopist: Docia Chuck. Henrene Pastor , MD Age: 71 Referring MD:  Date of Birth: Jul 17, 1946 Gender: Female Account #: [id number] Procedure:                Upper GI endoscopy Indications:              Suspected esophageal reflux Medicines:                Monitored Anesthesia Care Procedure:                Pre-Anesthesia Assessment:                           - Prior to the procedure, a History and Physical                            was performed, and patient medications and                            allergies were reviewed. The patient's tolerance of                            previous anesthesia was also reviewed. The risks                            and benefits of the procedure and the sedation                            options and risks were discussed with the patient.                            All questions were answered, and informed consent                            was obtained. Prior Anticoagulants: The patient has                            taken no previous anticoagulant or antiplatelet                            agents. ASA Grade Assessment: II - A patient with                            mild systemic disease. After reviewing the risks                            and benefits, the patient was deemed in                            satisfactory condition to undergo the procedure.                           After obtaining informed consent, the endoscope was  passed under direct vision. Throughout the                            procedure, the patient's blood pressure, pulse, and                            oxygen saturations were monitored continuously. The                            Endoscope was introduced through the mouth, and                            advanced to the second part of duodenum. The upper                            GI endoscopy was accomplished  without difficulty.                            The patient tolerated the procedure well. Scope In: Scope Out: Findings:                 LA Grade A (one or more mucosal breaks less than 5                            mm, not extending between tops of 2 mucosal folds)                            esophagitis was found.                           One mild benign-appearing, intrinsic stenosis was                            found 37 cm from the incisors. This measured 1.5 cm                            (inner diameter).                           The stomach was normal, save a small sliding hiatal                            hernia.                           The examined duodenum was normal.                           The cardia and gastric fundus were normal on                            retroflexion. Complications:            No immediate complications. Estimated Blood Loss:     Estimated blood loss: none. Impression:  1. 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 Small sliding hiatal hernia. Otherwise normal EGD Recommendation:           1. Reflux precautions                           2. Prescribe omeprazole 40 mg daily; #30; 11 refills                           3. Follow-up office appointment with Dr. Henrene Pastor in                            about 6-8 weeks. Docia Chuck. Henrene Pastor, MD 02/04/2017 4:09:10 PM This report has been signed electronically.

## 2017-02-05 ENCOUNTER — Telehealth: Payer: Self-pay | Admitting: *Deleted

## 2017-02-05 NOTE — Telephone Encounter (Signed)
  Follow up Call-  Call back number 02/04/2017  Post procedure Call Back phone  # [phone]  Permission to leave phone message Yes  Some recent data might be hidden     Patient questions:  Do you have a fever, pain , or abdominal swelling? No. Pain Score  0 *  Have you tolerated food without any problems? Yes.    Have you been able to return to your normal activities? Yes.    Do you have any questions about your discharge instructions: Diet   No. Medications  No. Follow up visit  No.  Do you have questions or concerns about your Care? No.  Actions: * If pain score is 4 or above: No action needed, pain <4.

## 2017-02-11 ENCOUNTER — Encounter: Payer: Self-pay | Admitting: Internal Medicine

## 2017-03-20 ENCOUNTER — Encounter: Payer: Self-pay | Admitting: Internal Medicine

## 2017-03-20 ENCOUNTER — Ambulatory Visit (INDEPENDENT_AMBULATORY_CARE_PROVIDER_SITE_OTHER): Payer: Federal, State, Local not specified - PPO | Admitting: Internal Medicine

## 2017-03-20 VITALS — BP 128/70 | HR 80 | Ht 64.75 in | Wt 145.0 lb

## 2017-03-20 DIAGNOSIS — K219 Gastro-esophageal reflux disease without esophagitis: Secondary | ICD-10-CM

## 2017-03-20 DIAGNOSIS — K222 Esophageal obstruction: Secondary | ICD-10-CM

## 2017-03-20 DIAGNOSIS — K209 Esophagitis, unspecified without bleeding: Secondary | ICD-10-CM

## 2017-03-20 DIAGNOSIS — R140 Abdominal distension (gaseous): Secondary | ICD-10-CM

## 2017-03-20 NOTE — Patient Instructions (Signed)
Please follow up in one year 

## 2017-03-20 NOTE — Progress Notes (Signed)
HISTORY OF PRESENT ILLNESS:  Katherine Stafford is a 71 y.o. female who was evaluated in the office 12/19/2016 for probable resolved acute ischemic colitis, chronic GERD, and the need for follow-up screening colonoscopy. See that dictation. On 02/04/2017 she underwent colonoscopy and upper endoscopy. Colonoscopy was normal except for internal hemorrhoids and a 5 mm descending colon polyp which was removed and found to be a sessile serrated polyp without dysplasia. Follow-up in 5 years recommended. Upper endoscopy revealed distal esophageal stricture with associated esophagitis and a small hiatal hernia. Otherwise normal. She was advised with regards to reflux precautions and prescribed omeprazole 40 mg daily. She was told follow-up at this time. Patient tells me that she has been taking her omeprazole to or 3 times per week. Has concerns over possible indications side effects. She will have reflux breakthrough with dietary indiscretion. She does report to me previous problems with intermittent solid food dysphagia including hamburger. No recent problems with dysphagia. Her any other complaint which is new is that of increased intestinal gas described as cramping which is typically relieved with defecation or passing flatus.  REVIEW OF SYSTEMS:  All non-GI ROS negative upon comprehensive review  Past Medical History:  Diagnosis Date  . Allergy   . GERD (gastroesophageal reflux disease)   . Hemangioma    2.7 liver lesion on chest ct  . Hyperlipidemia   . Hypertension   . Mitral valve prolapse   . Osteopenia   . Pectus excavatum    pt denies this   . PVC (premature ventricular contraction)     Past Surgical History:  Procedure Laterality Date  . COLONOSCOPY     10 yr ago- normal per pt.    Social History Katherine Stafford  reports that she has never smoked. She has never used smokeless tobacco. She reports that she drinks alcohol. She reports that she does not use drugs.  family history includes  Lymphoma in her father; Mitral valve prolapse in her mother; Rheum arthritis in her daughter; Thyroid cancer in her daughter.  No Known Allergies     PHYSICAL EXAMINATION: Vital signs: BP 128/70   Pulse 80   Ht 5' 4.75" (1.645 m)   Wt 145 lb (65.8 kg)   BMI 24.32 kg/m   Constitutional: generally well-appearing, no acute distress Psychiatric: alert and oriented x3, cooperative Eyes: extraocular movements intact, anicteric, conjunctiva pink Mouth: oral pharynx moist, no lesions Neck: supple no lymphadenopathy Cardiovascular: heart regular rate and rhythm, no murmur Lungs: clear to auscultation bilaterally Abdomen: soft, nontender, nondistended, no obvious ascites, no peritoneal signs, normal bowel sounds, no organomegaly Rectal:Omitted Extremities: no clubbing, cyanosis, or lower extremity edema bilaterally Skin: no lesions on visible extremities Neuro: No focal deficits. Cranial nerves intact  ASSESSMENT:  #1. GERD, complicated by esophagitis and peptic stricture. #2. Remote history of intermittent solid food dysphagia #3. Increased intestinal gas as manifested by bloating and cramping #4. Sessile serrated polyp on recent colonoscopy #5. History of ischemic colitis. Resolved   PLAN:  #1. Reflux precautions #2. Advised to take omeprazole more regularly to avoid progressive complications of reflux disease. Discussed and addressed her concerns regarding potential side effects of long-term PPI use. A prescription with multiple refills had been electronically submitted #3. Trial of probiotic once daily to see if this helps with bloating and cramping #4. Routine surveillance colonoscopy around 2023 #5. Routine GI office follow-up in 1 year. Sooner if needed  15 minutes spent face-to-face with the patient. Greater than 50% a time  use for counseling regarding her complicated GERD and new problems with intestinal gas

## 2017-04-24 ENCOUNTER — Other Ambulatory Visit: Payer: Self-pay | Admitting: Family Medicine

## 2017-04-24 DIAGNOSIS — Z1231 Encounter for screening mammogram for malignant neoplasm of breast: Secondary | ICD-10-CM

## 2017-05-09 ENCOUNTER — Ambulatory Visit
Admission: RE | Admit: 2017-05-09 | Discharge: 2017-05-09 | Disposition: A | Discharge status: Home / Self Care | Payer: Federal, State, Local not specified - PPO | Source: Ambulatory Visit | Attending: Family Medicine | Admitting: Family Medicine

## 2017-05-09 DIAGNOSIS — Z1231 Encounter for screening mammogram for malignant neoplasm of breast: Secondary | ICD-10-CM

## 2018-04-16 IMAGING — MG 2D DIGITAL SCREENING BILATERAL MAMMOGRAM WITH CAD AND ADJUNCT TO
9 of 12 series · 9 of 28 positions shown · non-contrast
Comparison: Previous exam(s).

CLINICAL DATA: Screening.

EXAM:
2D DIGITAL SCREENING BILATERAL MAMMOGRAM WITH CAD AND ADJUNCT TOMO

[L CC synth-2D]
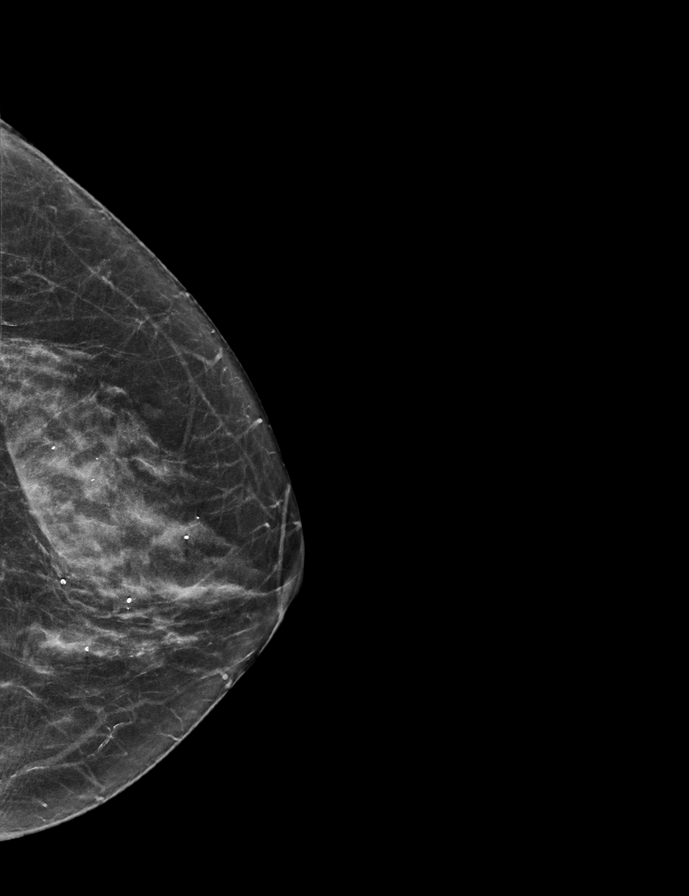

[R CC synth-2D]
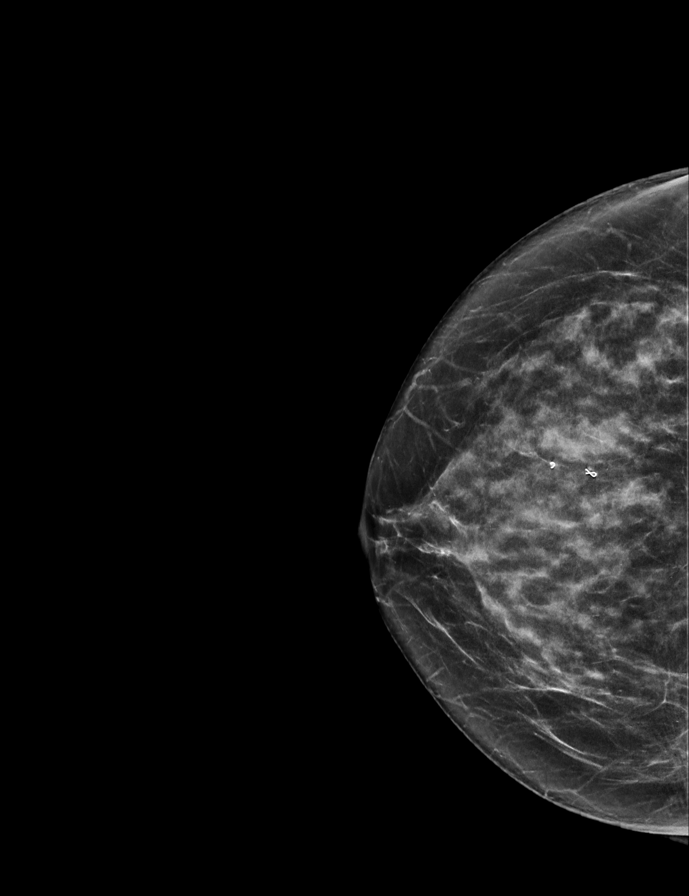

[L MLO synth-2D]
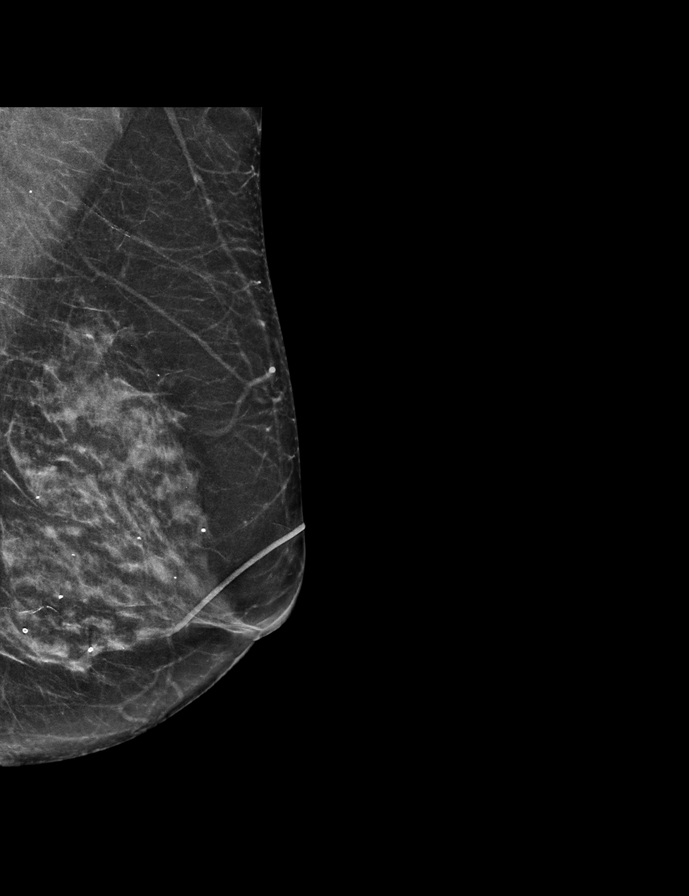

[L CC]
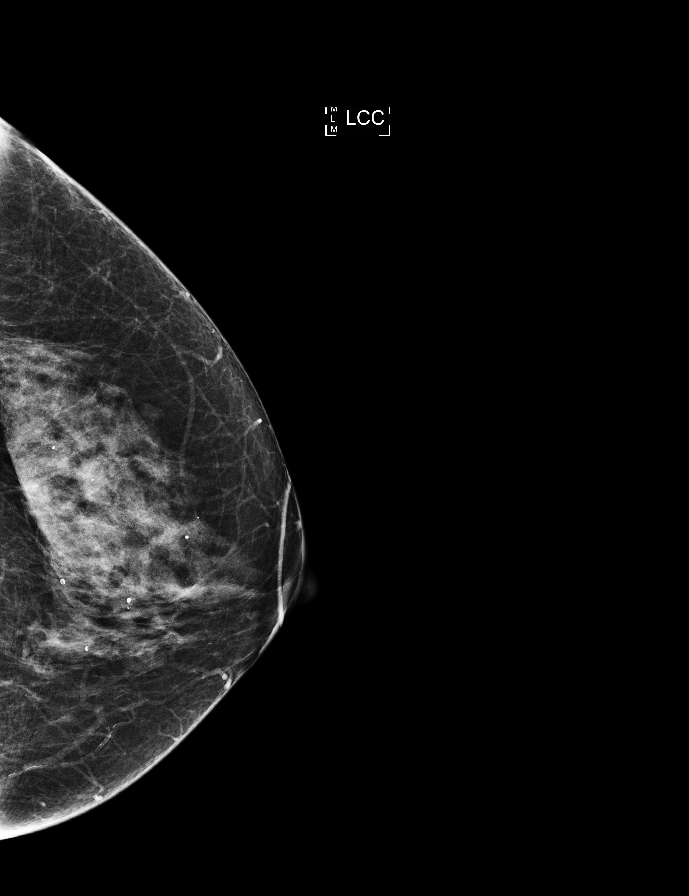

[R MLO]
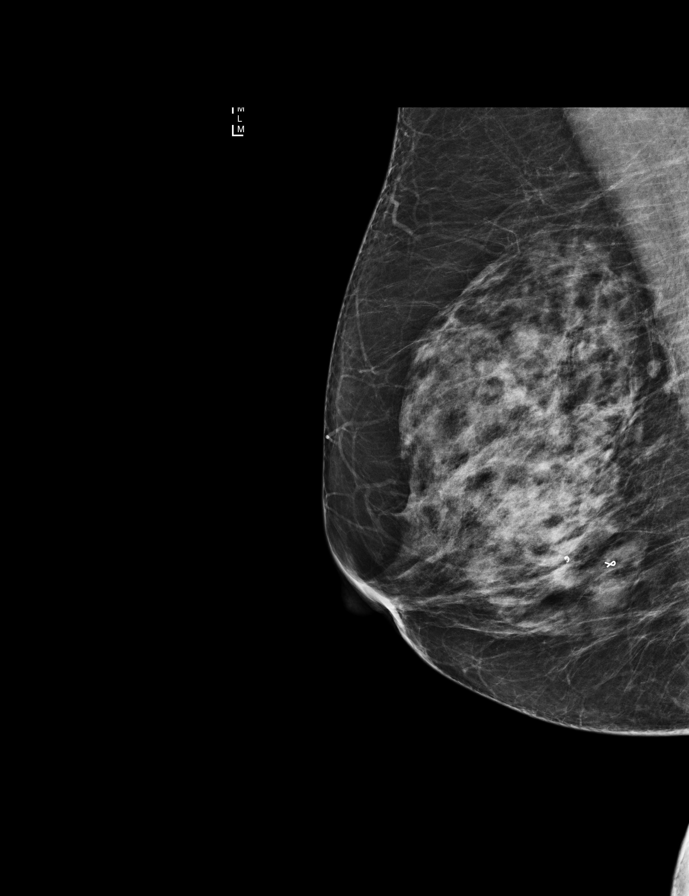

[R CC]
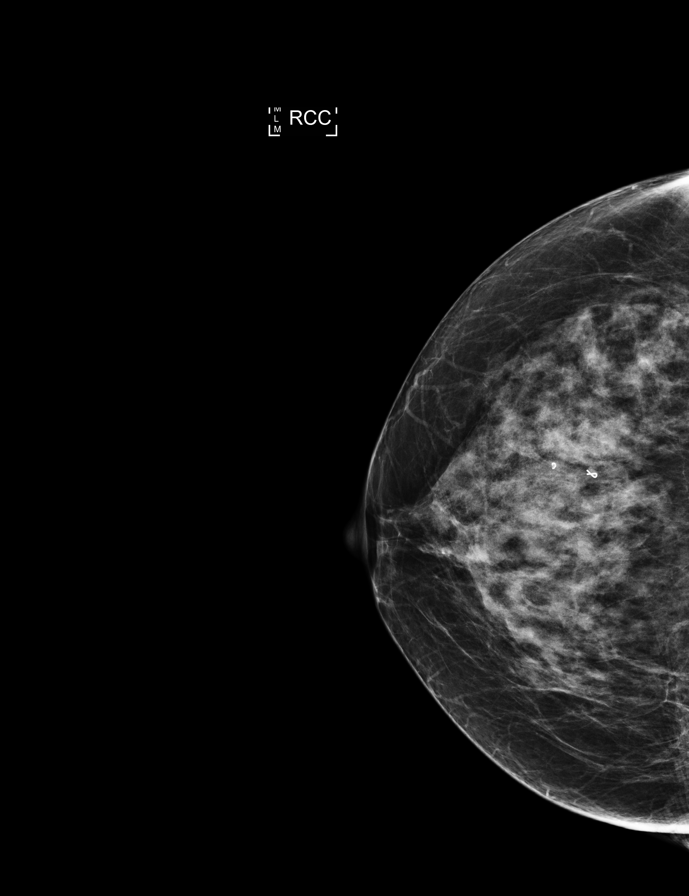

[R MLO synth-2D]
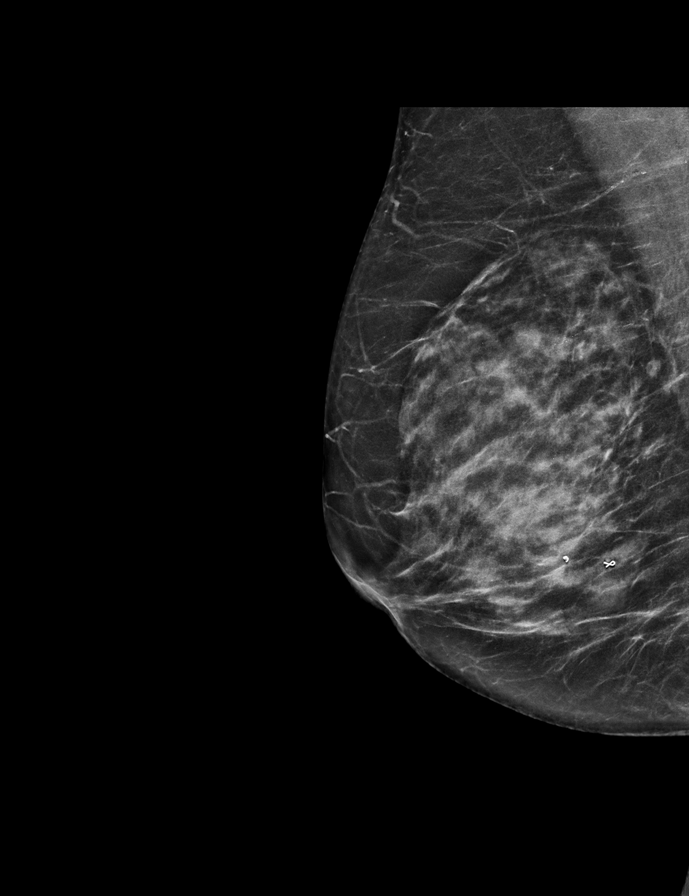

[L MLO]
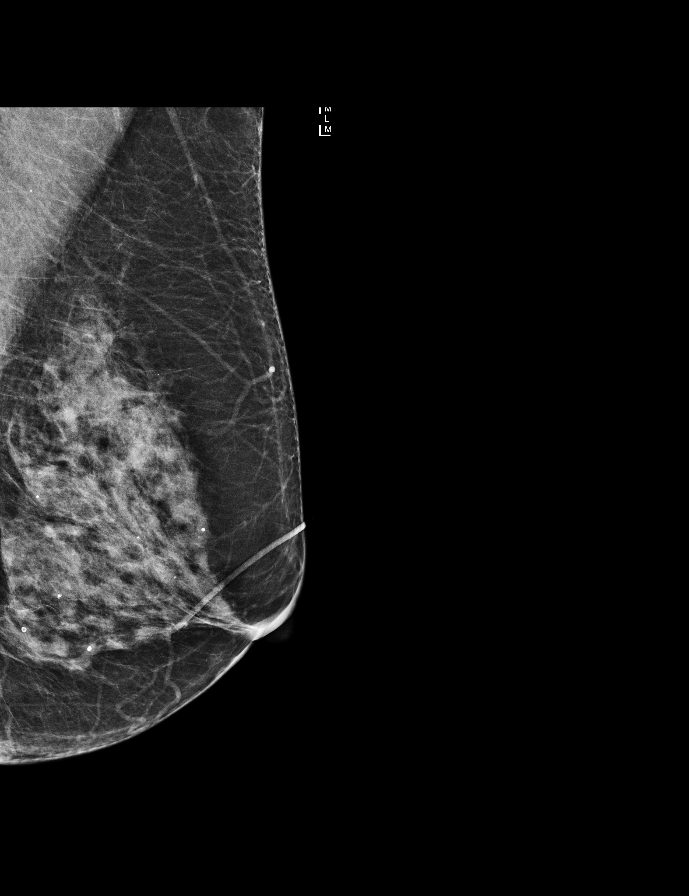

[L MLO tomo · tomo slice 28/55.0]
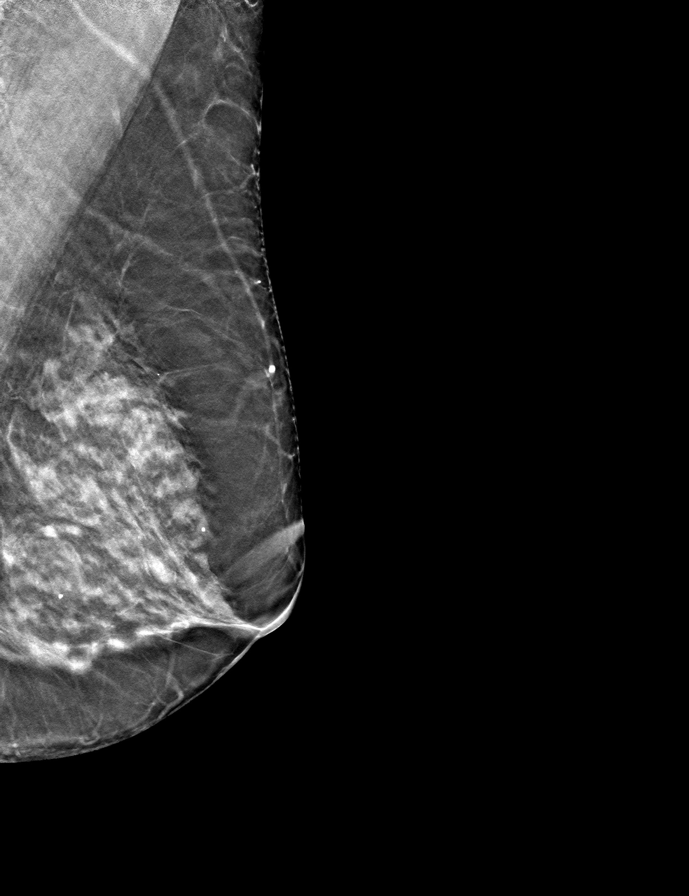

[9 of 28 positions shown; findings below may reference images not displayed]

ACR Breast Density Category c: The breast tissue is heterogeneously
dense, which may obscure small masses.
FINDINGS: There are no findings suspicious for malignancy. Images were
processed with CAD.
IMPRESSION: No mammographic evidence of malignancy. A result letter of this
screening mammogram will be mailed directly to the patient.

RECOMMENDATION:
Screening mammogram in one year. (Code:[id number]-[id number])

BI-RADS CATEGORY  1: Negative.

## 2018-05-12 ENCOUNTER — Other Ambulatory Visit: Payer: Self-pay | Admitting: Family Medicine

## 2018-05-12 DIAGNOSIS — Z1231 Encounter for screening mammogram for malignant neoplasm of breast: Secondary | ICD-10-CM

## 2018-06-16 ENCOUNTER — Ambulatory Visit
Admission: RE | Admit: 2018-06-16 | Discharge: 2018-06-16 | Disposition: A | Discharge status: Home / Self Care | Payer: Federal, State, Local not specified - PPO | Source: Ambulatory Visit | Attending: Family Medicine | Admitting: Family Medicine

## 2018-06-16 DIAGNOSIS — Z1231 Encounter for screening mammogram for malignant neoplasm of breast: Secondary | ICD-10-CM

## 2019-08-14 ENCOUNTER — Ambulatory Visit: Payer: Federal, State, Local not specified - PPO | Attending: Internal Medicine

## 2019-08-14 DIAGNOSIS — Z23 Encounter for immunization: Secondary | ICD-10-CM | POA: Insufficient documentation

## 2019-08-14 NOTE — Progress Notes (Signed)
   Covid-19 Vaccination [facility]  Name:  Katherine Stafford    MRN: [id number] DOB: [birth year]  08/14/2019  Ms. Robledo was observed post Covid-19 immunization for 15 minutes without incidence. She was provided with Vaccine Information Sheet and instruction to access the V-Safe system.   Ms. Lafavor was instructed to call 911 with any severe reactions post vaccine: Marland Kitchen Difficulty breathing  . Swelling of your face and throat  . A fast heartbeat  . A bad rash all over your body  . Dizziness and weakness    Immunizations Administered    Name Date Dose VIS Date Route   Pfizer COVID-19 Vaccine 08/14/2019 10:40 AM 0.3 mL 07/03/2019 Intramuscular   Manufacturer: [place]   Lot: [id number]   [place]: [id number]

## 2019-08-26 ENCOUNTER — Other Ambulatory Visit: Payer: Self-pay | Admitting: Family Medicine

## 2019-08-26 DIAGNOSIS — E2839 Other primary ovarian failure: Secondary | ICD-10-CM

## 2019-09-04 ENCOUNTER — Ambulatory Visit: Payer: Federal, State, Local not specified - PPO | Attending: Internal Medicine

## 2019-09-04 DIAGNOSIS — Z23 Encounter for immunization: Secondary | ICD-10-CM | POA: Insufficient documentation

## 2019-11-25 ENCOUNTER — Encounter: Payer: Self-pay | Admitting: Physician Assistant

## 2019-11-25 ENCOUNTER — Telehealth: Payer: Self-pay | Admitting: Internal Medicine

## 2019-11-25 ENCOUNTER — Ambulatory Visit: Payer: Federal, State, Local not specified - PPO | Admitting: Physician Assistant

## 2019-11-25 VITALS — BP 110/70 | HR 86 | Temp 98.5°F | Ht 64.75 in | Wt 140.0 lb

## 2019-11-25 DIAGNOSIS — K559 Vascular disorder of intestine, unspecified: Secondary | ICD-10-CM

## 2019-11-25 DIAGNOSIS — K5900 Constipation, unspecified: Secondary | ICD-10-CM | POA: Diagnosis not present

## 2019-11-25 DIAGNOSIS — Z86010 Personal history of colonic polyps: Secondary | ICD-10-CM

## 2019-11-25 DIAGNOSIS — Z8719 Personal history of other diseases of the digestive system: Secondary | ICD-10-CM | POA: Insufficient documentation

## 2019-11-25 MED ORDER — HYOSCYAMINE SULFATE 0.125 MG SL SUBL: 0.1250 mg | SUBLINGUAL_TABLET | Freq: Four times a day (QID) | SUBLINGUAL | 0 refills | Status: AC | PRN

## 2019-11-25 NOTE — Patient Instructions (Signed)
If you are age 74 or older, your body mass index should be between 23-30. Your Body mass index is 23.48 kg/m. If this is out of the aforementioned range listed, please consider follow up with your Primary Care Provider.  If you are age 85 or younger, your body mass index should be between 19-25. Your Body mass index is 23.48 kg/m. If this is out of the aformentioned range listed, please consider follow up with your Primary Care Provider.   Please start Miralax every other day 1 capful in 8 oz of water.  Increase water intake to 60 oz a day  Follow up as needed. Call if any reoccurrence of symptoms.  We have sent the following medications to your pharmacy for you to pick up at your convenience:  Levsin sublingual 1 under tongue every 6 hours as needed for abdominal cramping.   Due to recent changes in healthcare laws, you may see the results of your imaging and laboratory studies on MyChart before your provider has had a chance to review them.  We understand that in some cases there may be results that are confusing or concerning to you. Not all laboratory results come back in the same time frame and the provider may be waiting for multiple results in order to interpret others.  Please give [place] 48 hours in order for your provider to thoroughly review all the results before contacting the office for clarification of your results.

## 2019-11-25 NOTE — Telephone Encounter (Signed)
Pt with hx of ischemic colitis, has not had issues since 2018. Pt started having abd cramping and blood in stool yesterday and it has continued this am. Pt scheduled to see Nicoletta Ba PA today at 2pm. Pt aware of appt.

## 2019-11-25 NOTE — Progress Notes (Signed)
Subjective:    Patient ID: Katherine Stafford, female    DOB: 1946-02-07, 74 y.o.   MRN: [id number]  HPI Katherine Stafford is a pleasant 74 year old white female, established with Dr. Henrene Pastor who comes in today after an acute episode yesterday with severe abdominal cramping and bloody diarrhea.. Patient had an episode of segmental ischemic colitis in 2018 which was diagnosed by history and CT.  She then had colonoscopy in July 2018 with finding of 1 5 mm polyp in the left colon and internal hemorrhoids.  By path the polyp was a sessile serrated polyp.  She also had EGD at that same time with grade a esophagitis and a benign distal stricture measuring 1.5 cm in diameter and not dilated.  Also noted a small hiatal hernia.  Patient says she has been doing well over the past couple of years, is not requiring any regular medication for reflux and uses Pepcid as needed.  She had noticed some constipation recently and says she has been under a lot of stress over the past couple of months. Yesterday afternoon after eating lunch about an hour later she developed severe abdominal cramping, followed by nausea vomiting diaphoresis, diarrhea which became bloody.  She says this was similar to the episode she had in 2018.  She continued to have some diarrhea mixed with blood every couple of hours over the next several hours.  This morning she says her stool was semiformed and did not contain any blood with the last bowel movement She is feeling better, no further nausea and vomiting, no fever.  She has been keeping down some soft foods.  She is still having some domino cramping though significantly improved over yesterday. She has not been on any new medications, no hormones, no cold medications.  Says she has not been very good about her diet recently and may have been a bit dehydrated.  Review of Systems Pertinent positive and negative review of systems were noted in the above HPI section.  All other review of systems was otherwise  negative.  Outpatient Encounter Medications as of 11/25/2019  Medication Sig  . amLODipine-benazepril (LOTREL) 5-20 MG capsule Take 1 capsule by mouth 2 (two) times daily.  Marland Kitchen BYSTOLIC 5 MG tablet Take 5 mg by mouth daily.  . Calcium-Vitamin D-Vitamin K (VIACTIV PO) Take 1 tablet by mouth daily.  . Multiple Vitamin (MULTIVITAMIN WITH MINERALS) TABS Take 1 tablet by mouth daily.  . hyoscyamine (LEVSIN SL) 0.125 MG SL tablet Place 1 tablet (0.125 mg total) under the tongue every 6 (six) hours as needed.  . [DISCONTINUED] levocetirizine (XYZAL) 5 MG tablet Take 5 mg by mouth every evening.  . [DISCONTINUED] montelukast (SINGULAIR) 10 MG tablet Take 10 mg by mouth every morning.   . [DISCONTINUED] omeprazole (PRILOSEC) 40 MG capsule Take 1 capsule (40 mg total) by mouth daily.   No facility-administered encounter medications on file as of 11/25/2019.   No Known Allergies Patient Active Problem List   Diagnosis Date Noted  . History of ischemic colitis 11/25/2019  . Hx of adenomatous polyp of colon 11/25/2019   Social History   Socioeconomic History  . Marital status: Married    Spouse name: Not on file  . Number of children: 3  . Years of education: Not on file  . Highest education level: Not on file  Occupational History  . Occupation: retired  Tobacco Use  . Smoking status: Never Smoker  . Smokeless tobacco: Never Used  Substance and Sexual Activity  .  Alcohol use: Yes    Comment: social wine or cocktail  . Drug use: No  . Sexual activity: Not on file  Other Topics Concern  . Not on file  Social History Narrative  . Not on file   Social Determinants of Health   Financial Resource Strain:   . Difficulty of Paying Living Expenses:   Food Insecurity:   . Worried About Charity fundraiser in the Last Year:   . Arboriculturist in the Last Year:   Transportation Needs:   . Film/video editor (Medical):   Marland Kitchen Lack of Transportation (Non-Medical):   Physical Activity:   .  Days of Exercise per Week:   . Minutes of Exercise per Session:   Stress:   . Feeling of Stress :   Social Connections:   . Frequency of Communication with Friends and Family:   . Frequency of Social Gatherings with Friends and Family:   . Attends Religious Services:   . Active Member of Clubs or Organizations:   . Attends Archivist Meetings:   Marland Kitchen Marital Status:   Intimate Partner Violence:   . Fear of Current or Ex-Partner:   . Emotionally Abused:   Marland Kitchen Physically Abused:   . Sexually Abused:     Ms. Scaffidi's family history includes Lymphoma in her father; Mitral valve prolapse in her mother; Rheum arthritis in her daughter; Thyroid cancer in her daughter.      Objective:    Vitals:   11/25/19 1400  BP: 110/70  Pulse: 86  Temp: 98.5 F (36.9 C)    Physical Exam Well-developed well-nourished older white female in no acute distress.  , Weight 140, BMI 23.4  HEENT; nontraumatic normocephalic, EOMI, PER R LA, sclera anicteric. Oropharynx; not examined today Neck; supple, no JVD Cardiovascular; regular rate and rhythm with S1-S2, no murmur rub or gallop Pulmonary; Clear bilaterally Abdomen; soft, she is minimally tender in the left lower quadrant, no guarding or rebound, nondistended, no palpable mass or hepatosplenomegaly, bowel sounds are active Rectal; not done today Skin; benign exam, no jaundice rash or appreciable lesions Extremities; no clubbing cyanosis or edema skin warm and dry Neuro/Psych; alert and oriented x4, grossly nonfocal mood and affect appropriate       Assessment & Plan:   #29 74 year old white female with acute episode onset yesterday with severe crampy abdominal pain, nausea vomiting diaphoresis diarrhea followed by bloody diarrhea.  Symptoms significantly improved today with no further nausea vomiting or bleeding and diarrhea resolving. Symptoms are very reminiscent of a prior episode of segmental ischemic colitis in 2018, and her symptoms  are very suggestive of recurrent segmental ischemic colitis. Fortunately I think she had a mild episode and is already resolving.  #2 history of sessile serrated polyp-up-to-date with colonoscopy, due for follow-up 2023 #3 GERD-infrequent symptoms managed with famotidine #4 hypertension #5 hyperlipidemia  Plan; natural history and management of segmental ischemic colitis discussed with the patient. She will gradually advance her diet. Advise she increase her water intake on a consistent basis to at least 60 ounces per day. She will use MiraLAX 17 g in 8 ounces of water daily or every other day to manage her mild constipation. Prescription sent for Levsin sublingual to use every 6 hours as needed for abdominal cramping. She will follow-up with Dr. Henrene Pastor or myself on an as-needed basis and was advised to call should she have any recurrence of symptoms.  Amy S Esterwood PA-C 11/25/2019   Cc:  Jonathon Jordan, MD

## 2019-11-25 NOTE — Progress Notes (Signed)
Assessment and plan reviewed 

## 2020-02-17 ENCOUNTER — Other Ambulatory Visit: Payer: Self-pay | Admitting: Family Medicine

## 2020-02-17 DIAGNOSIS — Z1231 Encounter for screening mammogram for malignant neoplasm of breast: Secondary | ICD-10-CM

## 2020-03-03 ENCOUNTER — Ambulatory Visit
Admission: RE | Admit: 2020-03-03 | Discharge: 2020-03-03 | Disposition: A | Discharge status: Home / Self Care | Payer: Federal, State, Local not specified - PPO | Source: Ambulatory Visit | Attending: Family Medicine | Admitting: Family Medicine

## 2020-03-03 ENCOUNTER — Other Ambulatory Visit: Payer: Self-pay

## 2020-03-03 DIAGNOSIS — Z1231 Encounter for screening mammogram for malignant neoplasm of breast: Secondary | ICD-10-CM

## 2021-06-05 ENCOUNTER — Other Ambulatory Visit: Payer: Self-pay

## 2021-06-05 ENCOUNTER — Ambulatory Visit: Payer: Federal, State, Local not specified - PPO | Attending: Internal Medicine

## 2021-06-05 ENCOUNTER — Other Ambulatory Visit (HOSPITAL_BASED_OUTPATIENT_CLINIC_OR_DEPARTMENT_OTHER): Payer: Self-pay

## 2021-06-05 DIAGNOSIS — Z23 Encounter for immunization: Secondary | ICD-10-CM

## 2021-06-05 MED ORDER — PFIZER COVID-19 VAC BIVALENT 30 MCG/0.3ML IM SUSP
INTRAMUSCULAR | 0 refills | Status: AC
  Filled 2021-06-05: qty 0.3, 1d supply, fill #0

## 2021-06-05 NOTE — Progress Notes (Signed)
   Covid-19 Vaccination Clinic  Name:  NORENA BRATTON    MRN: [id number] DOB: [birth year]  06/05/2021  Ms. Taflinger was observed post Covid-19 immunization for 15 minutes without incident. She was provided with Vaccine Information Sheet and instruction to access the V-Safe system.   Ms. Christley was instructed to call 911 with any severe reactions post vaccine: Difficulty breathing  Swelling of face and throat  A fast heartbeat  A bad rash all over body  Dizziness and weakness   Immunizations Administered     Name Date Dose VIS Date Route   Pfizer Covid-19 Vaccine Bivalent Booster 06/05/2021  2:53 PM 0.3 mL 03/22/2021 Intramuscular   Manufacturer: [place]   Lot: [id number]   [place]: [phone]

## 2021-08-29 ENCOUNTER — Other Ambulatory Visit: Payer: Self-pay | Admitting: Family Medicine

## 2021-08-29 DIAGNOSIS — Z1231 Encounter for screening mammogram for malignant neoplasm of breast: Secondary | ICD-10-CM

## 2021-09-13 ENCOUNTER — Ambulatory Visit
Admission: RE | Admit: 2021-09-13 | Discharge: 2021-09-13 | Disposition: A | Discharge status: Home / Self Care | Payer: Federal, State, Local not specified - PPO | Source: Ambulatory Visit | Attending: Family Medicine | Admitting: Family Medicine

## 2021-09-13 DIAGNOSIS — Z1231 Encounter for screening mammogram for malignant neoplasm of breast: Secondary | ICD-10-CM

## 2022-01-12 ENCOUNTER — Emergency Department (HOSPITAL_BASED_OUTPATIENT_CLINIC_OR_DEPARTMENT_OTHER)
Admission: EM | Admit: 2022-01-12 | Discharge: 2022-01-12 | Disposition: A | Discharge status: Home / Self Care | Payer: Federal, State, Local not specified - PPO | Attending: Emergency Medicine | Admitting: Emergency Medicine

## 2022-01-12 ENCOUNTER — Other Ambulatory Visit: Payer: Self-pay

## 2022-01-12 ENCOUNTER — Emergency Department (HOSPITAL_BASED_OUTPATIENT_CLINIC_OR_DEPARTMENT_OTHER): Payer: Federal, State, Local not specified - PPO

## 2022-01-12 ENCOUNTER — Encounter (HOSPITAL_BASED_OUTPATIENT_CLINIC_OR_DEPARTMENT_OTHER): Payer: Self-pay | Admitting: Emergency Medicine

## 2022-01-12 DIAGNOSIS — Y92009 Unspecified place in unspecified non-institutional (private) residence as the place of occurrence of the external cause: Secondary | ICD-10-CM | POA: Diagnosis not present

## 2022-01-12 DIAGNOSIS — X501XXA Overexertion from prolonged static or awkward postures, initial encounter: Secondary | ICD-10-CM | POA: Insufficient documentation

## 2022-01-12 DIAGNOSIS — M79645 Pain in left finger(s): Secondary | ICD-10-CM | POA: Diagnosis present

## 2022-01-12 MED ORDER — OXYCODONE-ACETAMINOPHEN 5-325 MG PO TABS
1.0000 | ORAL_TABLET | Freq: Once | ORAL | Status: AC
Start: 2022-01-12 — End: 2022-01-12
  Administered 2022-01-12: 1 via ORAL
  Filled 2022-01-12: qty 1

## 2022-01-12 MED ORDER — LIDOCAINE HCL (PF) 1 % IJ SOLN
5.0000 mL | Freq: Once | INTRAMUSCULAR | Status: AC
  Administered 2022-01-12: 5 mL
  Filled 2022-01-12: qty 5

## 2022-01-12 MED ORDER — ONDANSETRON 4 MG PO TBDP
4.0000 mg | ORAL_TABLET | Freq: Once | ORAL | Status: AC
  Administered 2022-01-12: 4 mg via ORAL
  Filled 2022-01-12: qty 1

## 2022-01-19 ENCOUNTER — Encounter: Payer: Self-pay | Admitting: Orthopedic Surgery

## 2022-01-19 ENCOUNTER — Ambulatory Visit: Payer: Federal, State, Local not specified - PPO | Admitting: Orthopedic Surgery

## 2022-01-19 DIAGNOSIS — M65332 Trigger finger, left middle finger: Secondary | ICD-10-CM

## 2022-01-19 NOTE — Progress Notes (Signed)
Office Visit Note   Patient: Katherine Stafford           Date of Birth: [birth year]           MRN: [id number] Visit Date: 01/19/2022              Requested by: Jonathon Jordan, MD [address] [place],  [place] [address] PCP: Jonathon Jordan, MD   Assessment & Plan: Visit Diagnoses:  1. Trigger finger, left middle finger     Plan: Patient has a severe left middle finger trigger finger.  She recently went to the ER because she was unable to unlock it herself.  We discussed the nature of trigger finger as well as his diagnosis, prognosis, and both conservative and surgical treatment options.  We discussed observation versus corticosteroid junction versus A1 pulley release.  We reviewed the risks of surgery including bleeding, infection, damage to neurovascular structures, incomplete symptom relief, and need for additional surgery.  We discussed the expected postoperative course and rehab process.  After discussion, she would like to proceed with open A1 pulley release.  A surgical date and time will be confirmed with the patient.  Follow-Up Instructions: No follow-ups on file.   Orders:  No orders of the defined types were placed in this encounter.  No orders of the defined types were placed in this encounter.     Procedures: No procedures performed   Clinical Data: No additional findings.   Subjective: Chief Complaint  Patient presents with   Left Middle Finger - Pain    NKI, Onset x 1 Week, can't bend it or it gets stuck.    This is a 76 year old right-hand-dominant female who presents with painful triggering of the left middle finger.  This has been going on for at least a year now.  It has progressively worsened.  She went to the emergency room over the weekend with her left middle finger locked in a flexed position.  She was unable to unlock it herself.  She underwent digital block and the finger was able to be extended passively.  This is becoming very  bothersome for her.  She denies any previous triggering of other digits or any current involvement of other digits.  She had no treatment for this so far.    Review of Systems   Objective: Vital Signs: BP (!) 144/74 (BP Location: Left Arm, Patient Position: Sitting)   Pulse 76   Ht '5\' 5"'$  (1.651 m)   Wt 139 lb 15.9 oz (63.5 kg)   BMI 23.30 kg/m   Physical Exam Constitutional:      Appearance: Normal appearance.  Cardiovascular:     Rate and Rhythm: Normal rate.     Pulses: Normal pulses.  Pulmonary:     Effort: Pulmonary effort is normal.  Skin:    General: Skin is warm and dry.     Capillary Refill: Capillary refill takes less than 2 seconds.  Neurological:     Mental Status: She is alert.     Left Hand Exam   Tenderness  Left hand tenderness location: TTP at the middle finger A1 pulley.   Other  Erythema: absent Sensation: normal Pulse: present  Comments:  Palpable and visible triggering of the middle finger.       Specialty Comments:  No specialty comments available.  Imaging: No results found.   PMFS History: Patient Active Problem List   Diagnosis Date Noted   Trigger finger, left middle finger 01/19/2022  History of ischemic colitis 11/25/2019   Hx of adenomatous polyp of colon 11/25/2019   Past Medical History:  Diagnosis Date   Allergy    GERD (gastroesophageal reflux disease)    Hemangioma    2.7 liver lesion on chest ct   Hyperlipidemia    Hypertension    Mitral valve prolapse    Osteopenia    Pectus excavatum    pt denies this    PVC (premature ventricular contraction)     Family History  Problem Relation Age of Onset   Mitral valve prolapse Mother    Lymphoma Father    Rheum arthritis Daughter    Thyroid cancer Daughter    Colon cancer Neg Hx    Colon polyps Neg Hx    Esophageal cancer Neg Hx    Rectal cancer Neg Hx    Stomach cancer Neg Hx     Past Surgical History:  Procedure Laterality Date   BREAST EXCISIONAL  BIOPSY Left 1990   COLONOSCOPY     10 yr ago- normal per pt.   Social History   Occupational History   Occupation: retired  Tobacco Use   Smoking status: Never   Smokeless tobacco: Never  Vaping Use   Vaping Use: Never used  Substance and Sexual Activity   Alcohol use: Yes    Comment: social wine or cocktail   Drug use: No   Sexual activity: Not on file

## 2022-02-16 ENCOUNTER — Ambulatory Visit (INDEPENDENT_AMBULATORY_CARE_PROVIDER_SITE_OTHER): Payer: Federal, State, Local not specified - PPO | Admitting: Orthopedic Surgery

## 2022-02-16 DIAGNOSIS — M65332 Trigger finger, left middle finger: Secondary | ICD-10-CM

## 2022-02-16 NOTE — Progress Notes (Signed)
   Date of Surgery: 02/16/2022  INDICATIONS: Patient is a 76 y.o.-year-old female with a left middle trigger finger.  We previously discussed both conservative and surgical treatment options with the patient opting for surgical management.  Risks, benefits, and alternatives to surgery were again discussed with the patient in the preoperative area. The patient wishes to proceed with surgery.  Informed consent was signed after our discussion.   PREOPERATIVE DIAGNOSIS:  Left middle trigger finger   POSTOPERATIVE DIAGNOSIS: Same.  PROCEDURE:  Left middle finger A1 pulley release   SURGEON: Audria Nine, M.D.  ASSIST:   ANESTHESIA:  Local  IV FLUIDS AND URINE: See anesthesia.  ESTIMATED BLOOD LOSS: <5 mL.  IMPLANTS: * No surgical log found *   DRAINS: None  COMPLICATIONS: None  DESCRIPTION OF PROCEDURE: The patient was met in the preoperative holding area where the surgical site was marked and the consent form was verified.  A local block was performed using 10cc of 1% plain lidocaine. The patient was then taken to the procedure room and transferred to the operating table.  A tourniquet was applied to the left forearm.  he operative extremity was prepped and draped in the usual and sterile fashion.  A formal time-out was performed to confirm that this was the correct patient, surgery, side, and site.   The limb was exsanguinated and the tourniquet inflated to 250 mmHg.  A longitudinal incision was made over the A1 pulley.  The skin was incised.  Blunt dissection was used to identify the A1 pulley.  Two Ragnell retractors were placed on the radial and ulnar sides of the pulley to protect the respective neurovascular bundles.  A third Ragnell was placed at the distal aspect of the wound.  The A1 pulley was clearly identified.  Under direct visualization, the pulley was entered sharply using a 15 blade.  Tenotomy scissors were used to complete the pulley release distally to the level of  the A2 pulley.  The distal retractor was then placed in the proximal aspect of the wound.  Under direct visualization, the proximal aspect of the A1 pulley was completely released.   Following satisfactory A1 pulley release, the tourniquet was let down and the patient was asked to fully flex and extend the involved finger.  There was no catching or triggering present.  The wound was then thoroughly irrigated.  It was closed using 4-0 nylon sutures in a horizontal mattress fashion.  The wound was dressed with xeroform, 4x4, and an ace wrap.    POSTOPERATIVE PLAN: She will be discharged to home with appropriate discharge instructions.  I will see her back in the office in 10-14 days for her first postop visit.   Audria Nine, MD 8:59 AM

## 2022-02-16 NOTE — Progress Notes (Signed)
Patient presents today for open left middle finger A1 pulley release.  She still complains of painful locking and catching of this finger.  She denies any systemic symptoms today and has no new medical problems since our last visit.   PE:  Gen: Aox3, NAD Pulm: Normal WOB on RA CV: BUE warm and well perfused, normal rate L hand: TTP at middle finger A1 pulley, palpable nodule and subtle catching at A1 pulley   We again reviewed the risks of surgery including, but not limited to, bleeding, infection, damage to neurovascular structures, incomplete symptom relief, need for additional surgery.  Informed consent was signed.   Plan for open left middle finger A1 pulley release   Sherilyn Cooter, M.D. [place]

## 2022-02-26 ENCOUNTER — Ambulatory Visit (INDEPENDENT_AMBULATORY_CARE_PROVIDER_SITE_OTHER): Payer: Federal, State, Local not specified - PPO | Admitting: Orthopedic Surgery

## 2022-02-26 DIAGNOSIS — M65332 Trigger finger, left middle finger: Secondary | ICD-10-CM

## 2022-02-26 NOTE — Progress Notes (Signed)
   Post-Op Visit Note   Patient: Katherine Stafford           Date of Birth: 04/09/1946           MRN: [id number] Visit Date: 02/26/2022 PCP: Jonathon Jordan, MD   Assessment & Plan:  Chief Complaint:  Chief Complaint  Patient presents with   Left Hand - Routine Post Op   Visit Diagnoses:  1. Trigger finger, left middle finger     Plan: Patient is 10 days s/p left middle finger A1 pulley release.  She is doing well postoperatively.  Her incision is clean, dry, and well approximated.  The nylon sutures were removed.  She can start scar massage.  She has full PROm of the finger with no clicking, catching, or locking. She will continue to work on AROM on her own.  I can see her back again as needed.   Follow-Up Instructions: No follow-ups on file.   Orders:  No orders of the defined types were placed in this encounter.  No orders of the defined types were placed in this encounter.   Imaging: No results found.  PMFS History: Patient Active Problem List   Diagnosis Date Noted   Trigger finger, left middle finger 01/19/2022   History of ischemic colitis 11/25/2019   Hx of adenomatous polyp of colon 11/25/2019   Past Medical History:  Diagnosis Date   Allergy    GERD (gastroesophageal reflux disease)    Hemangioma    2.7 liver lesion on chest ct   Hyperlipidemia    Hypertension    Mitral valve prolapse    Osteopenia    Pectus excavatum    pt denies this    PVC (premature ventricular contraction)     Family History  Problem Relation Age of Onset   Mitral valve prolapse Mother    Lymphoma Father    Rheum arthritis Daughter    Thyroid cancer Daughter    Colon cancer Neg Hx    Colon polyps Neg Hx    Esophageal cancer Neg Hx    Rectal cancer Neg Hx    Stomach cancer Neg Hx     Past Surgical History:  Procedure Laterality Date   BREAST EXCISIONAL BIOPSY Left 1990   COLONOSCOPY     10 yr ago- normal per pt.   Social History   Occupational History   Occupation:  retired  Tobacco Use   Smoking status: Never   Smokeless tobacco: Never  Vaping Use   Vaping Use: Never used  Substance and Sexual Activity   Alcohol use: Yes    Comment: social wine or cocktail   Drug use: No   Sexual activity: Not on file

## 2022-07-11 ENCOUNTER — Other Ambulatory Visit (HOSPITAL_BASED_OUTPATIENT_CLINIC_OR_DEPARTMENT_OTHER): Payer: Self-pay

## 2022-07-11 MED ORDER — COMIRNATY 30 MCG/0.3ML IM SUSY
PREFILLED_SYRINGE | INTRAMUSCULAR | 0 refills | Status: AC
  Filled 2022-07-11: qty 0.3, 1d supply, fill #0

## 2022-09-20 LAB — LAB REPORT - SCANNED
A1c: 5.6
EGFR: 69

## 2022-09-27 ENCOUNTER — Ambulatory Visit: Payer: Federal, State, Local not specified - PPO | Admitting: Internal Medicine

## 2022-09-27 ENCOUNTER — Encounter: Payer: Self-pay | Admitting: Internal Medicine

## 2022-09-27 VITALS — BP 124/74 | HR 68 | Ht 65.0 in | Wt 145.0 lb

## 2022-09-27 DIAGNOSIS — K625 Hemorrhage of anus and rectum: Secondary | ICD-10-CM

## 2022-09-27 DIAGNOSIS — K219 Gastro-esophageal reflux disease without esophagitis: Secondary | ICD-10-CM | POA: Diagnosis not present

## 2022-09-27 DIAGNOSIS — R1310 Dysphagia, unspecified: Secondary | ICD-10-CM

## 2022-09-27 DIAGNOSIS — Z86010 Personal history of colonic polyps: Secondary | ICD-10-CM

## 2022-09-27 MED ORDER — NA SULFATE-K SULFATE-MG SULF 17.5-3.13-1.6 GM/177ML PO SOLN: 1.0000 | Freq: Once | ORAL | 0 refills | Status: AC

## 2022-09-27 MED ORDER — PANTOPRAZOLE SODIUM 40 MG PO TBEC: 40.0000 mg | DELAYED_RELEASE_TABLET | Freq: Every day | ORAL | 3 refills | Status: DC

## 2022-09-27 NOTE — Progress Notes (Signed)
HISTORY OF PRESENT ILLNESS:  Katherine Stafford is a 77 y.o. female with past medical history as listed below.  She scheduled this appointment the other day regarding rectal bleeding.  Patient was last seen in this office by the GI physician assistant Nov 25, 2019 regarding possible recurrent ischemic colitis.  See that dictation.  The patient's GI history is remarkable for ischemic colitis, chronic GERD, and routine colonoscopies.  She tells me that 3 days ago she had a bowel movement and had bright red blood in the toilet bowl.  Moderately significant.  No associated rectal or abdominal pain.  No bleeding thereafter.  She does tell me that she has intermittent reflux symptoms for which she takes Pepcid.  She also describes significant intermittent solid food dysphagia with associated discomfort about once per week.  She did undergo upper endoscopy July 2018 and was found to have esophagitis as well as a peptic stricture for which she was prescribed omeprazole.  Not taking.  Last colonoscopy also in July 2018.  In addition to internal hemorrhoids she was found to have a sessile serrated polyp for which follow-up in 5 years recommended.  Her last CT scan in 2018 showed changes consistent with ischemic colitis.  Finally, the patient had questions regarding her husband's need for colonoscopy.  I reviewed his chart.  He has multiple comorbidities and has aged out of surveillance  REVIEW OF SYSTEMS:  All non-GI ROS negative. Past Medical History:  Diagnosis Date   Allergy    GERD (gastroesophageal reflux disease)    Hemangioma    2.7 liver lesion on chest ct   Hyperlipidemia    Hypertension    Mitral valve prolapse    Osteopenia    Pectus excavatum    pt denies this    PVC (premature ventricular contraction)     Past Surgical History:  Procedure Laterality Date   BREAST EXCISIONAL BIOPSY Left 1990   COLONOSCOPY     10 yr ago- normal per pt.    Social History Katherine Stafford  reports that she  has never smoked. She has never used smokeless tobacco. She reports current alcohol use. She reports that she does not use drugs.  family history includes Lymphoma in her father; Mitral valve prolapse in her mother; Rheum arthritis in her daughter; Thyroid cancer in her daughter.  No Known Allergies     PHYSICAL EXAMINATION: Vital signs: BP 124/74   Pulse 68   Ht '5\' 5"'$  (1.651 m)   Wt 145 lb (65.8 kg)   BMI 24.13 kg/m   Constitutional: generally well-appearing, no acute distress Psychiatric: alert and oriented x3, cooperative Eyes: extraocular movements intact, anicteric, conjunctiva pink Mouth: oral pharynx moist, no lesions Neck: supple no lymphadenopathy Cardiovascular: heart regular rate and rhythm, no murmur Lungs: clear to auscultation bilaterally Abdomen: soft, nontender, nondistended, no obvious ascites, no peritoneal signs, normal bowel sounds, no organomegaly Rectal: Deferred to colonoscopy Extremities: no clubbing, cyanosis, or lower extremity edema bilaterally Skin: no lesions on visible extremities Neuro: No focal deficits.  Cranial nerves intact  ASSESSMENT:  1.  Isolated episode of painless rectal bleeding.  Suspect secondary to internal hemorrhoids. 2.  Personal history of sessile serrated polyp.  Due for surveillance 3.  Chronic GERD.  Ongoing symptoms.  Not on regular therapy. 4.  Intermittent solid food dysphagia.  Significant.  Known peptic stricture. 5.  General medical problems.  Stable   PLAN:  1.  Reflux precautions 2.  Prescribe pantoprazole 40 mg daily.  Medication risks reviewed 3.  Schedule upper endoscopy with probable esophageal dilation.The nature of the procedure, as well as the risks, benefits, and alternatives were carefully and thoroughly reviewed with the patient. Ample time for discussion and questions allowed. The patient understood, was satisfied, and agreed to proceed. 4.  Schedule colonoscopy for surveillance and to evaluate rectal  bleeding.The nature of the procedure, as well as the risks, benefits, and alternatives were carefully and thoroughly reviewed with the patient. Ample time for discussion and questions allowed. The patient understood, was satisfied, and agreed to proceed. A total time of 40 minutes was spent preparing to see the patient, obtaining comprehensive history, performing medically appropriate physical examination, counseling and educating the patient regarding the above listed issues, ordering medication, ordering therapeutic endoscopic procedure, and ordering colonoscopy.  Finally, documenting clinical information in the health record

## 2022-09-27 NOTE — Patient Instructions (Signed)
_______________________________________________________  If your blood pressure at your visit was 140/90 or greater, please contact your primary care physician to follow up on this.  _______________________________________________________  If you are age 77 or older, your body mass index should be between 23-30. Your Body mass index is 24.13 kg/m. If this is out of the aforementioned range listed, please consider follow up with your Primary Care Provider.  If you are age 86 or younger, your body mass index should be between 19-25. Your Body mass index is 24.13 kg/m. If this is out of the aformentioned range listed, please consider follow up with your Primary Care Provider.   ________________________________________________________  The [facility] providers would like to encourage you to use [facility] to communicate with providers for non-urgent requests or questions.  Due to long hold times on the telephone, sending your provider a message by [facility] may be a faster and more efficient way to get a response.  Please allow 48 business hours for a response.  Please remember that this is for non-urgent requests.  _______________________________________________________  Katherine Stafford have been scheduled for an endoscopy and colonoscopy. Please follow the written instructions given to you at your visit today. Please pick up your prep supplies at the pharmacy within the next 1-3 days. If you use inhalers (even only as needed), please bring them with you on the day of your procedure.

## 2022-10-16 LAB — AMB REFERRAL TO GASTROENTEROLOGY
Albumin, Urine POC: 4.4
EGFR: 69
Hemoglobin A1C: 5.6 % (ref 4.0–5.6)

## 2022-10-17 ENCOUNTER — Encounter: Payer: Self-pay | Admitting: Internal Medicine

## 2022-10-26 ENCOUNTER — Encounter: Payer: Self-pay | Admitting: Internal Medicine

## 2022-10-26 ENCOUNTER — Ambulatory Visit (AMBULATORY_SURGERY_CENTER): Payer: Federal, State, Local not specified - PPO | Admitting: Internal Medicine

## 2022-10-26 ENCOUNTER — Encounter: Payer: Federal, State, Local not specified - PPO | Admitting: Internal Medicine

## 2022-10-26 VITALS — BP 125/59 | HR 61 | Temp 96.8°F | Resp 9 | Ht 65.0 in | Wt 145.0 lb

## 2022-10-26 DIAGNOSIS — K219 Gastro-esophageal reflux disease without esophagitis: Secondary | ICD-10-CM

## 2022-10-26 DIAGNOSIS — Z86010 Personal history of colonic polyps: Secondary | ICD-10-CM

## 2022-10-26 DIAGNOSIS — Z09 Encounter for follow-up examination after completed treatment for conditions other than malignant neoplasm: Secondary | ICD-10-CM | POA: Diagnosis not present

## 2022-10-26 DIAGNOSIS — K222 Esophageal obstruction: Secondary | ICD-10-CM

## 2022-10-26 DIAGNOSIS — K625 Hemorrhage of anus and rectum: Secondary | ICD-10-CM

## 2022-10-26 DIAGNOSIS — D120 Benign neoplasm of cecum: Secondary | ICD-10-CM | POA: Diagnosis not present

## 2022-10-26 DIAGNOSIS — R1310 Dysphagia, unspecified: Secondary | ICD-10-CM

## 2022-10-26 MED ORDER — SODIUM CHLORIDE 0.9 % IV SOLN: 500.0000 mL | INTRAVENOUS | Status: DC

## 2022-10-26 NOTE — Progress Notes (Signed)
Patient reports no changes to health or medications since office visit.  

## 2022-10-26 NOTE — Op Note (Signed)
[facility] Patient Name: Arline AspJanet Voorhis Procedure Date: 10/26/2022 2:19 PM MRN: [id number] Endoscopist: Wilhemina BonitoJohn N. Marina GoodellPerry , MD, [phone] Age: [age] Referring MD:  Date of Birth: [birth year] Gender: Female Account #: [id number] Procedure:                Colonoscopy with cold snare polypectomy x 1 Indications:              High risk colon cancer surveillance: Personal                            history of sessile serrated colon polyp (less than                            10 mm in size) with no dysplasia, rectal bleeding.                            Previous examination 2018. Medicines:                Monitored Anesthesia Care Procedure:                Pre-Anesthesia Assessment:                           - Prior to the procedure, a History and Physical                            was performed, and patient medications and                            allergies were reviewed. The patient's tolerance of                            previous anesthesia was also reviewed. The risks                            and benefits of the procedure and the sedation                            options and risks were discussed with the patient.                            All questions were answered, and informed consent                            was obtained. Prior Anticoagulants: The patient has                            taken no anticoagulant or antiplatelet agents. ASA                            Grade Assessment: II - A patient with mild systemic                            disease. After reviewing the risks and benefits,  the patient was deemed in satisfactory condition to                            undergo the procedure.                           After obtaining informed consent, the colonoscope                            was passed under direct vision. Throughout the                            procedure, the patient's blood pressure, pulse, and                            oxygen  saturations were monitored continuously. The                            Olympus [id number] was introduced through the anus                            and advanced to the the cecum, identified by                            appendiceal orifice and ileocecal valve. The                            ileocecal valve, appendiceal orifice, and rectum                            were photographed. The quality of the bowel                            preparation was excellent. The colonoscopy was                            performed without difficulty. The patient tolerated                            the procedure well. The bowel preparation used was                            SUPREP via split dose instruction. Scope In: 2:29:27 PM Scope Out: 2:44:02 PM Scope Withdrawal Time: 0 hours 10 minutes 25 seconds  Total Procedure Duration: 0 hours 14 minutes 35 seconds  Findings:                 A 2 mm polyp was found in the cecum. The polyp was                            sessile. The polyp was removed with a cold snare.                            Resection and retrieval were complete.  The exam was otherwise without abnormality on                            direct and retroflexion views. Complications:            No immediate complications. Estimated blood loss:                            None. Estimated Blood Loss:     Estimated blood loss: none. Impression:               - One 2 mm polyp in the cecum, removed with a cold                            snare. Resected and retrieved.                           - The examination was otherwise normal on direct                            and retroflexion views. Recommendation:           - Repeat colonoscopy is not recommended for                            surveillance.                           - Patient has a contact number available for                            emergencies. The signs and symptoms of potential                             delayed complications were discussed with the                            patient. Return to normal activities tomorrow.                            Written discharge instructions were provided to the                            patient.                           - Resume previous diet.                           - Continue present medications.                           - Await pathology results. Wilhemina BonitoJohn N. Marina GoodellPerry, MD 10/26/2022 2:54:01 PM This report has been signed electronically.

## 2022-10-26 NOTE — Progress Notes (Signed)
HISTORY OF PRESENT ILLNESS:   Katherine Stafford is a 77 y.o. female with past medical history as listed below.  She scheduled this appointment the other day regarding rectal bleeding.  Patient was last seen in this office by the GI physician assistant Nov 25, 2019 regarding possible recurrent ischemic colitis.  See that dictation.   The patient's GI history is remarkable for ischemic colitis, chronic GERD, and routine colonoscopies.  She tells me that 3 days ago she had a bowel movement and had bright red blood in the toilet bowl.  Moderately significant.  No associated rectal or abdominal pain.  No bleeding thereafter.  She does tell me that she has intermittent reflux symptoms for which she takes Pepcid.  She also describes significant intermittent solid food dysphagia with associated discomfort about once per week.  She did undergo upper endoscopy July 2018 and was found to have esophagitis as well as a peptic stricture for which she was prescribed omeprazole.  Not taking.  Last colonoscopy also in July 2018.  In addition to internal hemorrhoids she was found to have a sessile serrated polyp for which follow-up in 5 years recommended.  Her last CT scan in 2018 showed changes consistent with ischemic colitis.   Finally, the patient had questions regarding her husband's need for colonoscopy.  I reviewed his chart.  He has multiple comorbidities and has aged out of surveillance   REVIEW OF SYSTEMS:   All non-GI ROS negative.     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      Social  History Katherine Stafford  reports that she has never smoked. She has never used smokeless tobacco. She reports current alcohol use. She reports that she does not use drugs.   family history includes Lymphoma in her father; Mitral valve prolapse in her mother; Rheum arthritis in her daughter; Thyroid cancer in her daughter.   No Known Allergies       PHYSICAL EXAMINATION: Vital signs: BP 124/74   Pulse 68   Ht 5\' 5"  (1.651 m)   Wt 145 lb (65.8 kg)   BMI 24.13 kg/m   Constitutional: generally well-appearing, no acute distress Psychiatric: alert and oriented x3, cooperative Eyes: extraocular movements intact, anicteric, conjunctiva pink Mouth: oral pharynx moist, no lesions Neck: supple no lymphadenopathy Cardiovascular: heart regular rate and rhythm, no murmur Lungs: clear to auscultation bilaterally Abdomen: soft, nontender, nondistended, no obvious ascites, no peritoneal signs, normal bowel sounds, no organomegaly Rectal: Deferred to colonoscopy Extremities: no clubbing, cyanosis, or lower extremity edema bilaterally Skin: no lesions on visible extremities Neuro: No focal deficits.  Cranial nerves intact   ASSESSMENT:   1.  Isolated episode of painless rectal bleeding.  Suspect secondary to internal hemorrhoids. 2.  Personal history of sessile serrated polyp.  Due for surveillance 3.  Chronic GERD.  Ongoing symptoms.  Not  on regular therapy. 4.  Intermittent solid food dysphagia.  Significant.  Known peptic stricture. 5.  General medical problems.  Stable     PLAN:   1.  Reflux precautions 2.  Prescribe pantoprazole 40 mg daily.  Medication risks reviewed 3.  Schedule upper endoscopy with probable esophageal dilation.The nature of the procedure, as well as the risks, benefits, and alternatives were carefully and thoroughly reviewed with the patient. Ample time for discussion and questions allowed. The patient understood, was satisfied, and agreed to proceed. 4.  Schedule  colonoscopy for surveillance and to evaluate rectal bleeding.The nature of the procedure, as well as the risks, benefits, and alternatives were carefully and thoroughly reviewed with the patient. Ample time for discussion and questions allowed. The patient understood, was satisfied, and agreed to proceed.

## 2022-10-26 NOTE — Patient Instructions (Addendum)
Recommendation: - Patient has a contact number available for                            emergencies. The signs and symptoms of potential                            delayed complications were discussed with the                            patient. Return to normal activities tomorrow.                            Written discharge instructions were provided to the                            patient.                           -Post dilation diet.                           - Continue present medications.                           -Follow-up with Dr. Marina GoodellPerry as needed  Handouts on post didatation diet and polyps given.  YOU HAD AN ENDOSCOPIC PROCEDURE TODAY AT THE [facility]:   Refer to the procedure report that was given to you for any specific questions about what was found during the examination.  If the procedure report does not answer your questions, please call your gastroenterologist to clarify.  If you requested that your care partner not be given the details of your procedure findings, then the procedure report has been included in a sealed envelope for you to review at your convenience later.  YOU SHOULD EXPECT: Some feelings of bloating in the abdomen. Passage of more gas than usual.  Walking can help get rid of the air that was put into your GI tract during the procedure and reduce the bloating. If you had a lower endoscopy (such as a colonoscopy or flexible sigmoidoscopy) you may notice spotting of blood in your stool or on the toilet paper. If you underwent a bowel prep for your procedure, you may not have a normal bowel movement for a few days.  Please Note:  You might notice some irritation and congestion in your nose or some drainage.  This is from the oxygen used during your procedure.  There is no need for concern and it should clear up in a day or so.  SYMPTOMS TO REPORT IMMEDIATELY:  Following lower endoscopy (colonoscopy or flexible sigmoidoscopy):  Excessive amounts  of blood in the stool  Significant tenderness or worsening of abdominal pains  Swelling of the abdomen that is new, acute  Fever of 100F or higher  Following upper endoscopy (EGD)  Vomiting of blood or coffee ground material  New chest pain or pain under the shoulder blades  Painful or persistently difficult swallowing  New shortness of breath  Fever of 100F or higher  Black, tarry-looking stools  For urgent or emergent issues, a gastroenterologist can be reached at any hour by calling [phone]. Do not use  MyChart messaging for urgent concerns.    DIET:  We do recommend a small meal at first, but then you may proceed to your regular diet.  Drink plenty of fluids but you should avoid alcoholic beverages for 24 hours.  ACTIVITY:  You should plan to take it easy for the rest of today and you should NOT DRIVE or use heavy machinery until tomorrow (because of the sedation medicines used during the test).    FOLLOW UP: Our staff will call the number listed on your records the next business day following your procedure.  We will call around 7:15- 8:00 am to check on you and address any questions or concerns that you may have regarding the information given to you following your procedure. If we do not reach you, we will leave a message.     If any biopsies were taken you will be contacted by phone or by letter within the next 1-3 weeks.  Please call us at [phone] if you have not heard about the biopsies in 3 weeks.    SIGNATURES/CONFIDENTIALITY: You and/or your care partner have signed paperwork which will be entered into your electronic medical record.  These signatures attest to the fact that that the information above on your After Visit Summary has been reviewed and is understood.  Full responsibility of the confidentiality of this discharge information lies with you and/or your care-partner.

## 2022-10-26 NOTE — Op Note (Signed)
[facility] Patient Name: Katherine Stafford Procedure Date: 10/26/2022 2:18 PM MRN: [id number] Endoscopist: Katherine BonitoJohn N. Katherine GoodellPerry , MD, [phone] Age: [age] Referring MD:  Date of Birth: 1946-05-08 Gender: Female Account #: [id number] Procedure:                Upper GI endoscopy with balloon dilation of the                            esophagus. 20 mm max Indications:              Therapeutic procedure, Dysphagia, Esophageal reflux Medicines:                Monitored Anesthesia Care Procedure:                Pre-Anesthesia Assessment:                           - Prior to the procedure, a History and Physical                            was performed, and patient medications and                            allergies were reviewed. The patient's tolerance of                            previous anesthesia was also reviewed. The risks                            and benefits of the procedure and the sedation                            options and risks were discussed with the patient.                            All questions were answered, and informed consent                            was obtained. Prior Anticoagulants: The patient has                            taken no anticoagulant or antiplatelet agents. ASA                            Grade Assessment: II - A patient with mild systemic                            disease. After reviewing the risks and benefits,                            the patient was deemed in satisfactory condition to                            undergo the procedure.  After obtaining informed consent, the endoscope was                            passed under direct vision. Throughout the                            procedure, the patient's blood pressure, pulse, and                            oxygen saturations were monitored continuously. The                            GIF [id number] #[id number] was introduced through the                            mouth,  and advanced to the second part of duodenum.                            The upper GI endoscopy was accomplished without                            difficulty. The patient tolerated the procedure                            well. Scope In: Scope Out: Findings:                 One benign-appearing, ringlike intrinsic moderate                            stenosis was found 38 cm from the incisors. This                            stenosis measured 1.4 cm (inner diameter). The                            stenosis was traversed. A TTS dilator was passed                            through the scope. Dilation with an 18-19-20 mm                            balloon dilator was performed to 20 mm. Good                            mucosal disruption of the stricture, with heme,                            noted.                           The esophagus was otherwise normal.                           The stomach was normal. Moderate hiatal hernia.  The examined duodenum was normal.                           The cardia and gastric fundus were normal on                            retroflexion. Complications:            No immediate complications. Estimated Blood Loss:     Estimated blood loss: none. Impression:               - Benign-appearing esophageal stenosis. Dilated.                           - Normal esophagus otherwise.                           - Normal stomach save hiatal hernia.                           - Normal examined duodenum.                           - No specimens collected. Recommendation:           - Patient has a contact number available for                            emergencies. The signs and symptoms of potential                            delayed complications were discussed with the                            patient. Return to normal activities tomorrow.                            Written discharge instructions were provided to the                             patient.                           -Post dilation diet.                           - Continue present medications.                           -Follow-up with Dr. Marina Stafford as needed Katherine Bonito. Katherine Goodell, MD 10/26/2022 3:04:36 PM This report has been signed electronically.

## 2022-10-26 NOTE — Progress Notes (Signed)
Called to room to assist during endoscopic procedure.  Patient ID and intended procedure confirmed with present staff. Received instructions for my participation in the procedure from the performing physician.  

## 2022-10-29 ENCOUNTER — Telehealth: Payer: Self-pay | Admitting: *Deleted

## 2022-10-29 NOTE — Telephone Encounter (Signed)
  Follow up Call-     10/26/2022    1:47 PM  Call back number  Post procedure Call Back phone  # [phone]  Permission to leave phone message Yes     Patient questions:  Do you have a fever, pain , or abdominal swelling? No. Pain Score  0 *  Have you tolerated food without any problems? Yes.    Have you been able to return to your normal activities? Yes.    Do you have any questions about your discharge instructions: Diet   No. Medications  No. Follow up visit  No.  Do you have questions or concerns about your Care? No.  Actions: * If pain score is 4 or above: No action needed, pain <4.

## 2022-11-01 ENCOUNTER — Encounter: Payer: Self-pay | Admitting: Internal Medicine

## 2023-12-19 ENCOUNTER — Other Ambulatory Visit: Payer: Self-pay | Admitting: Internal Medicine
